# Patient Record
Sex: Male | Born: 1960 | Race: White | Hispanic: No | Marital: Married | Smoking: Never smoker
Health system: Southern US, Community
[De-identification: ages and names within clinical notes are randomized; demographics above are authoritative.]

## PROBLEM LIST (undated history)

## (undated) DIAGNOSIS — E785 Hyperlipidemia, unspecified: Secondary | ICD-10-CM

## (undated) HISTORY — DX: Hyperlipidemia, unspecified: E78.5

## (undated) HISTORY — PX: ANTERIOR CRUCIATE LIGAMENT REPAIR: SHX115

---

## 1998-09-06 ENCOUNTER — Encounter: Payer: Self-pay | Admitting: Family Medicine

## 1998-09-06 ENCOUNTER — Ambulatory Visit: Admission: RE | Admit: 1998-09-06 | Discharge: 1998-09-06 | Payer: Self-pay | Admitting: Family Medicine

## 1999-07-04 ENCOUNTER — Encounter: Payer: Self-pay | Admitting: Family Medicine

## 1999-07-04 ENCOUNTER — Emergency Department (HOSPITAL_COMMUNITY): Admission: EM | Admit: 1999-07-04 | Discharge: 1999-07-04 | Payer: Self-pay | Admitting: Emergency Medicine

## 2013-12-13 ENCOUNTER — Encounter: Payer: Self-pay | Admitting: *Deleted

## 2015-06-19 DIAGNOSIS — Z1159 Encounter for screening for other viral diseases: Secondary | ICD-10-CM | POA: Diagnosis not present

## 2015-06-19 DIAGNOSIS — Z0000 Encounter for general adult medical examination without abnormal findings: Secondary | ICD-10-CM | POA: Diagnosis not present

## 2015-06-19 DIAGNOSIS — E782 Mixed hyperlipidemia: Secondary | ICD-10-CM | POA: Diagnosis not present

## 2015-06-19 DIAGNOSIS — Z23 Encounter for immunization: Secondary | ICD-10-CM | POA: Diagnosis not present

## 2015-08-16 DIAGNOSIS — D126 Benign neoplasm of colon, unspecified: Secondary | ICD-10-CM | POA: Diagnosis not present

## 2015-08-16 DIAGNOSIS — D125 Benign neoplasm of sigmoid colon: Secondary | ICD-10-CM | POA: Diagnosis not present

## 2015-08-16 DIAGNOSIS — D123 Benign neoplasm of transverse colon: Secondary | ICD-10-CM | POA: Diagnosis not present

## 2015-08-16 DIAGNOSIS — K5730 Diverticulosis of large intestine without perforation or abscess without bleeding: Secondary | ICD-10-CM | POA: Diagnosis not present

## 2015-08-16 DIAGNOSIS — Z1211 Encounter for screening for malignant neoplasm of colon: Secondary | ICD-10-CM | POA: Diagnosis not present

## 2015-08-16 DIAGNOSIS — Z800 Family history of malignant neoplasm of digestive organs: Secondary | ICD-10-CM | POA: Diagnosis not present

## 2016-03-16 DIAGNOSIS — J209 Acute bronchitis, unspecified: Secondary | ICD-10-CM | POA: Diagnosis not present

## 2017-06-16 DIAGNOSIS — K141 Geographic tongue: Secondary | ICD-10-CM | POA: Diagnosis not present

## 2017-12-05 DIAGNOSIS — Z23 Encounter for immunization: Secondary | ICD-10-CM | POA: Diagnosis not present

## 2018-08-30 DIAGNOSIS — Z20828 Contact with and (suspected) exposure to other viral communicable diseases: Secondary | ICD-10-CM | POA: Diagnosis not present

## 2018-10-14 DIAGNOSIS — Q383 Other congenital malformations of tongue: Secondary | ICD-10-CM | POA: Diagnosis not present

## 2018-10-14 DIAGNOSIS — F411 Generalized anxiety disorder: Secondary | ICD-10-CM | POA: Diagnosis not present

## 2018-10-14 DIAGNOSIS — Z125 Encounter for screening for malignant neoplasm of prostate: Secondary | ICD-10-CM | POA: Diagnosis not present

## 2018-10-14 DIAGNOSIS — E782 Mixed hyperlipidemia: Secondary | ICD-10-CM | POA: Diagnosis not present

## 2018-10-14 DIAGNOSIS — Z0000 Encounter for general adult medical examination without abnormal findings: Secondary | ICD-10-CM | POA: Diagnosis not present

## 2018-10-14 DIAGNOSIS — L989 Disorder of the skin and subcutaneous tissue, unspecified: Secondary | ICD-10-CM | POA: Diagnosis not present

## 2018-10-31 DIAGNOSIS — L821 Other seborrheic keratosis: Secondary | ICD-10-CM | POA: Diagnosis not present

## 2018-10-31 DIAGNOSIS — D2272 Melanocytic nevi of left lower limb, including hip: Secondary | ICD-10-CM | POA: Diagnosis not present

## 2018-10-31 DIAGNOSIS — D229 Melanocytic nevi, unspecified: Secondary | ICD-10-CM | POA: Diagnosis not present

## 2018-10-31 DIAGNOSIS — L570 Actinic keratosis: Secondary | ICD-10-CM | POA: Diagnosis not present

## 2018-11-01 DIAGNOSIS — D101 Benign neoplasm of tongue: Secondary | ICD-10-CM | POA: Diagnosis not present

## 2018-11-01 DIAGNOSIS — K141 Geographic tongue: Secondary | ICD-10-CM | POA: Diagnosis not present

## 2018-11-01 DIAGNOSIS — J343 Hypertrophy of nasal turbinates: Secondary | ICD-10-CM | POA: Diagnosis not present

## 2019-01-05 DIAGNOSIS — R6884 Jaw pain: Secondary | ICD-10-CM | POA: Diagnosis not present

## 2019-03-01 DIAGNOSIS — F411 Generalized anxiety disorder: Secondary | ICD-10-CM | POA: Diagnosis not present

## 2019-05-15 DIAGNOSIS — F320 Major depressive disorder, single episode, mild: Secondary | ICD-10-CM | POA: Diagnosis not present

## 2019-05-15 DIAGNOSIS — F411 Generalized anxiety disorder: Secondary | ICD-10-CM | POA: Diagnosis not present

## 2019-05-15 DIAGNOSIS — E782 Mixed hyperlipidemia: Secondary | ICD-10-CM | POA: Diagnosis not present

## 2019-05-31 DIAGNOSIS — R6882 Decreased libido: Secondary | ICD-10-CM | POA: Diagnosis not present

## 2019-05-31 DIAGNOSIS — E782 Mixed hyperlipidemia: Secondary | ICD-10-CM | POA: Diagnosis not present

## 2019-10-20 ENCOUNTER — Other Ambulatory Visit: Payer: Self-pay | Admitting: Family Medicine

## 2019-10-20 DIAGNOSIS — Z125 Encounter for screening for malignant neoplasm of prostate: Secondary | ICD-10-CM | POA: Diagnosis not present

## 2019-10-20 DIAGNOSIS — Z0000 Encounter for general adult medical examination without abnormal findings: Secondary | ICD-10-CM | POA: Diagnosis not present

## 2019-10-20 DIAGNOSIS — R229 Localized swelling, mass and lump, unspecified: Secondary | ICD-10-CM | POA: Diagnosis not present

## 2019-10-20 DIAGNOSIS — E782 Mixed hyperlipidemia: Secondary | ICD-10-CM | POA: Diagnosis not present

## 2019-10-20 DIAGNOSIS — R222 Localized swelling, mass and lump, trunk: Secondary | ICD-10-CM

## 2019-10-20 DIAGNOSIS — Z85828 Personal history of other malignant neoplasm of skin: Secondary | ICD-10-CM | POA: Diagnosis not present

## 2019-10-20 DIAGNOSIS — Z23 Encounter for immunization: Secondary | ICD-10-CM | POA: Diagnosis not present

## 2019-10-20 DIAGNOSIS — F411 Generalized anxiety disorder: Secondary | ICD-10-CM | POA: Diagnosis not present

## 2019-10-23 ENCOUNTER — Other Ambulatory Visit: Payer: Self-pay

## 2019-10-25 ENCOUNTER — Ambulatory Visit
Admission: RE | Admit: 2019-10-25 | Discharge: 2019-10-25 | Disposition: A | Discharge status: Home / Self Care | Payer: BC Managed Care – PPO | Source: Ambulatory Visit | Attending: Family Medicine | Admitting: Family Medicine

## 2019-10-25 DIAGNOSIS — R222 Localized swelling, mass and lump, trunk: Secondary | ICD-10-CM | POA: Diagnosis not present

## 2019-10-25 DIAGNOSIS — D170 Benign lipomatous neoplasm of skin and subcutaneous tissue of head, face and neck: Secondary | ICD-10-CM | POA: Diagnosis not present

## 2019-10-25 DIAGNOSIS — R221 Localized swelling, mass and lump, neck: Secondary | ICD-10-CM | POA: Diagnosis not present

## 2019-10-25 DIAGNOSIS — D1779 Benign lipomatous neoplasm of other sites: Secondary | ICD-10-CM | POA: Diagnosis not present

## 2019-10-25 IMAGING — US US SOFT TISSUE
1 series · 8 of 8 positions shown · non-contrast
Comparison: None.

CLINICAL DATA: Subcutaneous mass anterior chest wall over sternum

EXAM:
ULTRASOUND OF HEAD/NECK SOFT TISSUES
TECHNIQUE: Ultrasound examination of the head and neck soft tissues was
performed in the area of clinical concern.

[Series 1: us soft tissue · 0.05mm/px · 8 acquisitions, 8 frames shown]
[im 1/8]
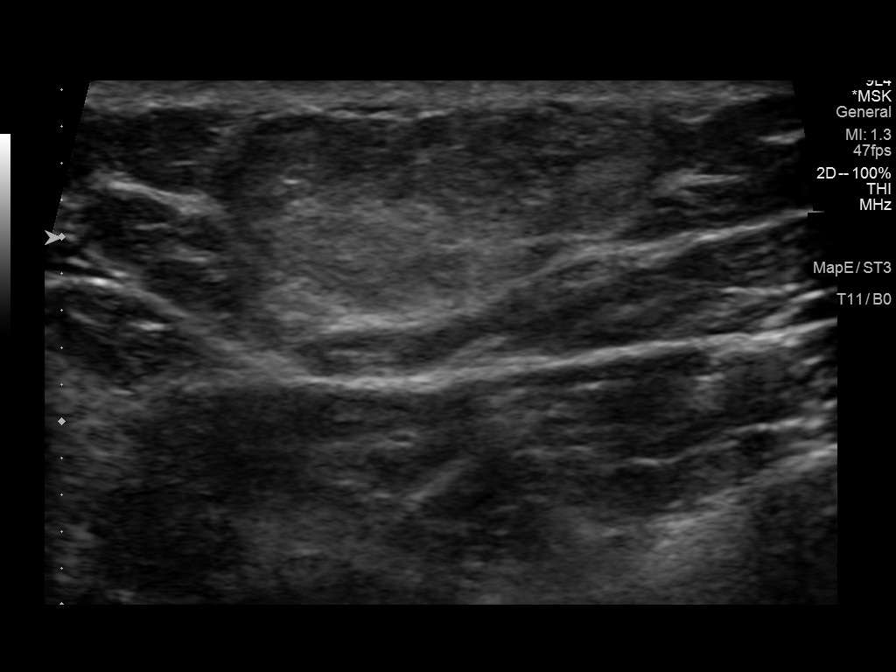
[im 2/8]
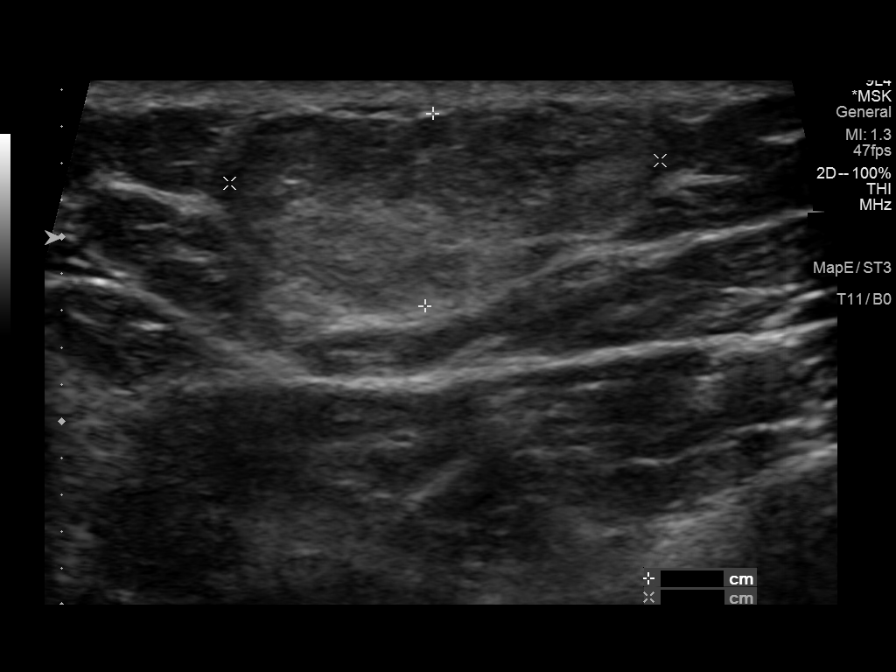
[im 3/8]
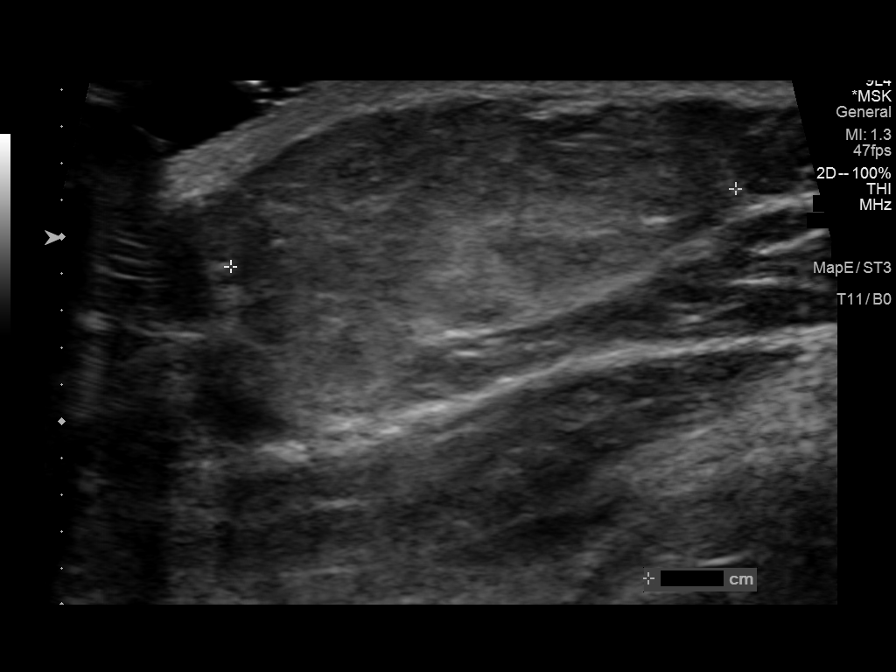
[im 4/8]
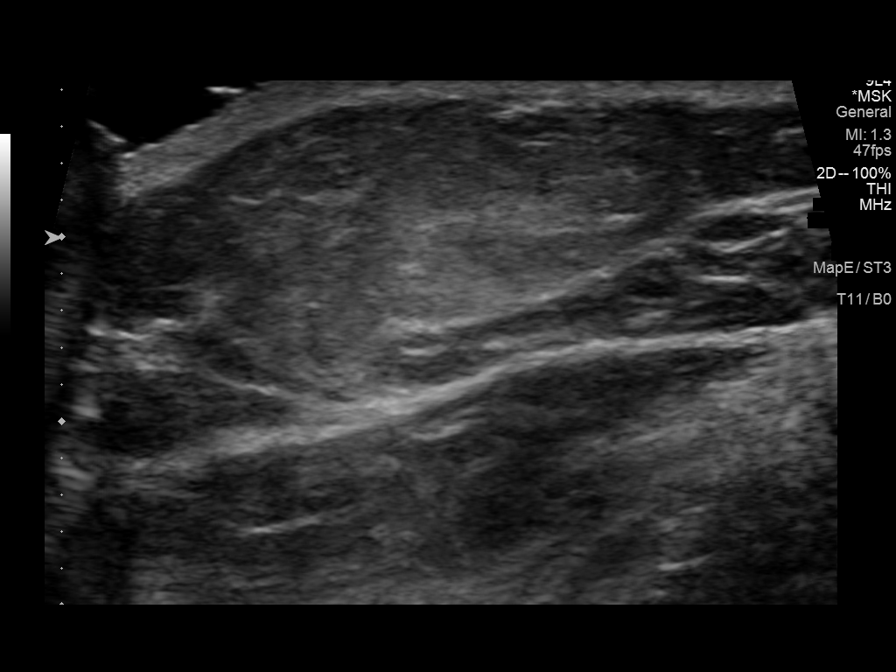
[im 5/8]
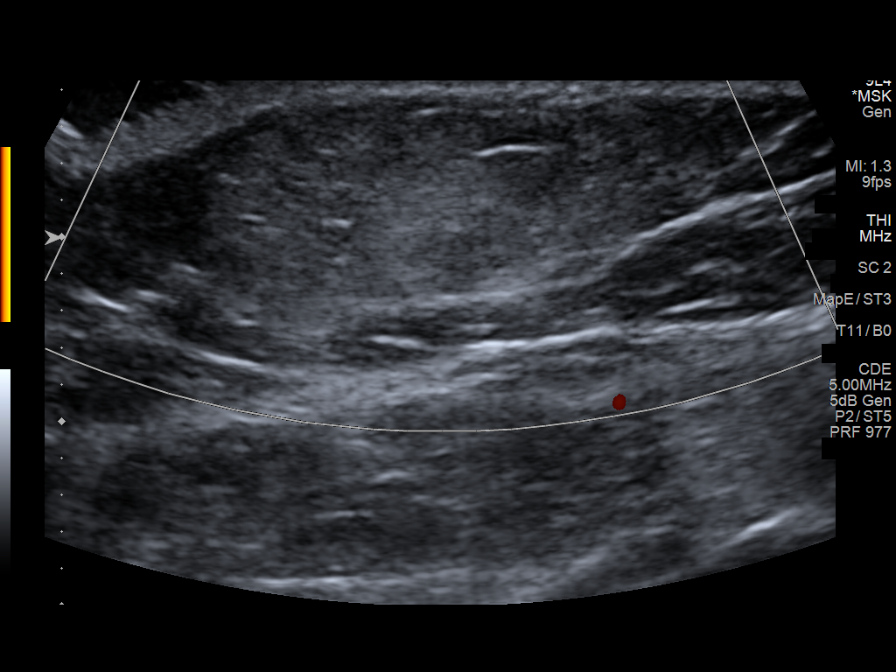
[im 6/8]
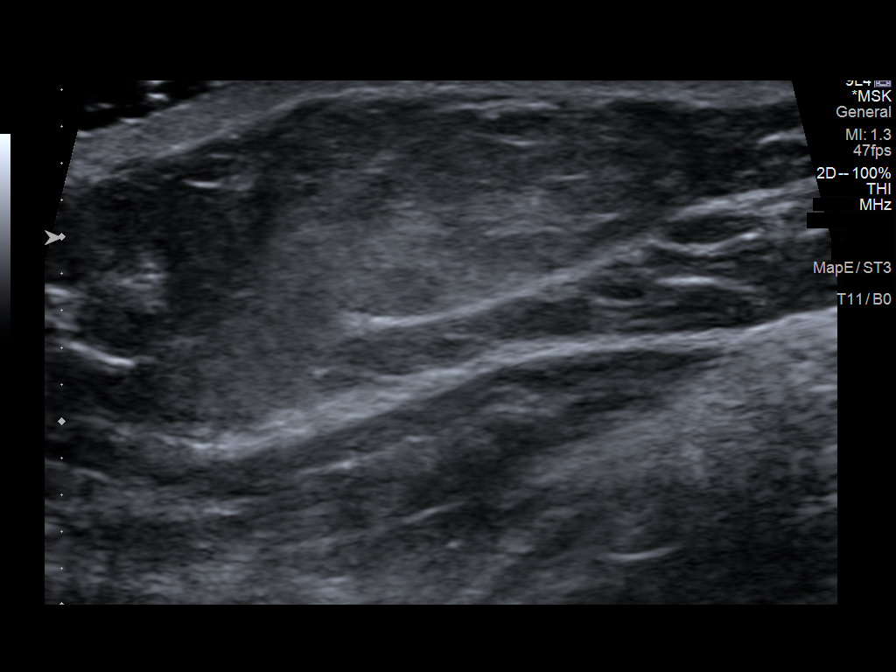
[im 7/8]
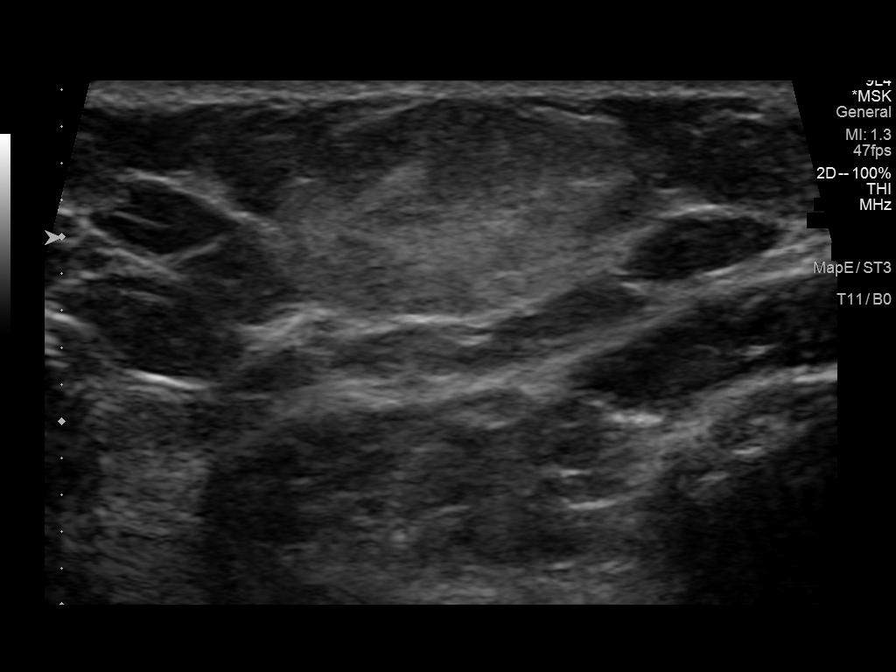
[im 8/8]
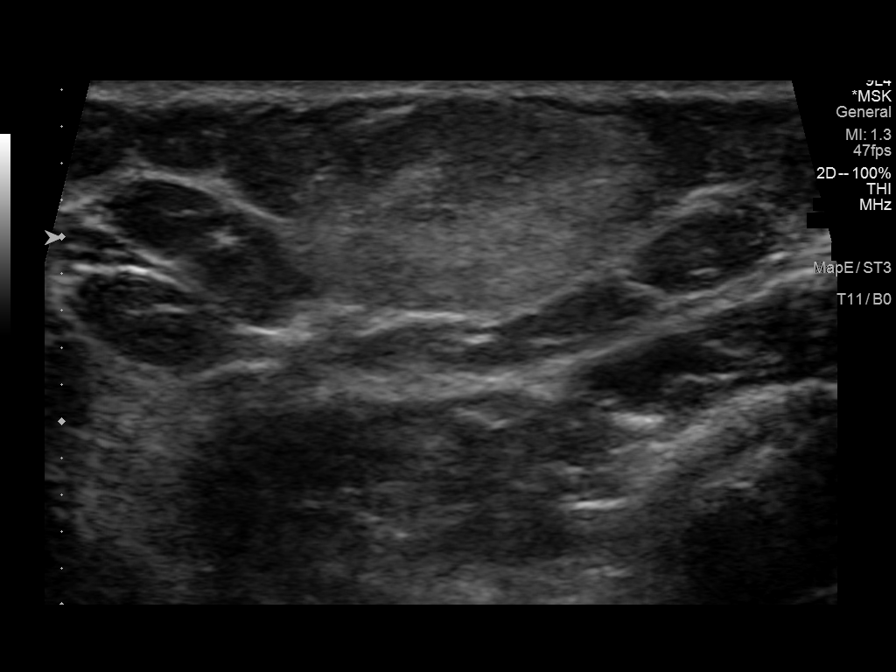

[8 of 8 positions shown; findings below may reference images not displayed]

FINDINGS: Ultrasound performed in the area concern, visualizing a 2.8 x 2.3 x
1.1 cm heterogeneous, slightly hyperechoic solid mass in the area of
concern. No other abnormality noted.
IMPRESSION: Slightly echogenic 2.8 cm subcutaneous mass in the area concern,
favor lipoma.

## 2019-12-18 DIAGNOSIS — L578 Other skin changes due to chronic exposure to nonionizing radiation: Secondary | ICD-10-CM | POA: Diagnosis not present

## 2019-12-18 DIAGNOSIS — B078 Other viral warts: Secondary | ICD-10-CM | POA: Diagnosis not present

## 2019-12-18 DIAGNOSIS — L573 Poikiloderma of Civatte: Secondary | ICD-10-CM | POA: Diagnosis not present

## 2019-12-18 DIAGNOSIS — Z85828 Personal history of other malignant neoplasm of skin: Secondary | ICD-10-CM | POA: Diagnosis not present

## 2019-12-18 DIAGNOSIS — L570 Actinic keratosis: Secondary | ICD-10-CM | POA: Diagnosis not present

## 2019-12-18 DIAGNOSIS — D225 Melanocytic nevi of trunk: Secondary | ICD-10-CM | POA: Diagnosis not present

## 2019-12-19 ENCOUNTER — Other Ambulatory Visit: Payer: Self-pay | Admitting: Surgery

## 2019-12-19 DIAGNOSIS — D171 Benign lipomatous neoplasm of skin and subcutaneous tissue of trunk: Secondary | ICD-10-CM | POA: Diagnosis not present

## 2020-11-29 DIAGNOSIS — Z86010 Personal history of colonic polyps: Secondary | ICD-10-CM | POA: Diagnosis not present

## 2020-11-29 DIAGNOSIS — K219 Gastro-esophageal reflux disease without esophagitis: Secondary | ICD-10-CM | POA: Diagnosis not present

## 2020-12-20 DIAGNOSIS — E7800 Pure hypercholesterolemia, unspecified: Secondary | ICD-10-CM | POA: Diagnosis not present

## 2020-12-20 DIAGNOSIS — Z0000 Encounter for general adult medical examination without abnormal findings: Secondary | ICD-10-CM | POA: Diagnosis not present

## 2020-12-20 DIAGNOSIS — Z125 Encounter for screening for malignant neoplasm of prostate: Secondary | ICD-10-CM | POA: Diagnosis not present

## 2020-12-20 DIAGNOSIS — R001 Bradycardia, unspecified: Secondary | ICD-10-CM | POA: Diagnosis not present

## 2020-12-20 DIAGNOSIS — R5383 Other fatigue: Secondary | ICD-10-CM | POA: Diagnosis not present

## 2020-12-20 DIAGNOSIS — R6882 Decreased libido: Secondary | ICD-10-CM | POA: Diagnosis not present

## 2020-12-20 DIAGNOSIS — Z23 Encounter for immunization: Secondary | ICD-10-CM | POA: Diagnosis not present

## 2021-01-21 ENCOUNTER — Emergency Department (HOSPITAL_BASED_OUTPATIENT_CLINIC_OR_DEPARTMENT_OTHER): Payer: BC Managed Care – PPO

## 2021-01-21 ENCOUNTER — Observation Stay (HOSPITAL_COMMUNITY): Payer: BC Managed Care – PPO

## 2021-01-21 ENCOUNTER — Inpatient Hospital Stay (HOSPITAL_BASED_OUTPATIENT_CLINIC_OR_DEPARTMENT_OTHER)
Admission: EM | Admit: 2021-01-21 | Discharge: 2021-01-22 | DRG: 066 | Disposition: A | Discharge status: Home / Self Care | Payer: BC Managed Care – PPO | Attending: Internal Medicine | Admitting: Internal Medicine

## 2021-01-21 ENCOUNTER — Inpatient Hospital Stay (HOSPITAL_COMMUNITY): Payer: BC Managed Care – PPO

## 2021-01-21 ENCOUNTER — Encounter (HOSPITAL_BASED_OUTPATIENT_CLINIC_OR_DEPARTMENT_OTHER): Payer: Self-pay | Admitting: *Deleted

## 2021-01-21 ENCOUNTER — Other Ambulatory Visit: Payer: Self-pay

## 2021-01-21 DIAGNOSIS — I6389 Other cerebral infarction: Secondary | ICD-10-CM | POA: Diagnosis not present

## 2021-01-21 DIAGNOSIS — G467 Other lacunar syndromes: Secondary | ICD-10-CM | POA: Diagnosis not present

## 2021-01-21 DIAGNOSIS — R200 Anesthesia of skin: Secondary | ICD-10-CM | POA: Diagnosis present

## 2021-01-21 DIAGNOSIS — Z888 Allergy status to other drugs, medicaments and biological substances status: Secondary | ICD-10-CM | POA: Diagnosis not present

## 2021-01-21 DIAGNOSIS — Z79899 Other long term (current) drug therapy: Secondary | ICD-10-CM

## 2021-01-21 DIAGNOSIS — Z8249 Family history of ischemic heart disease and other diseases of the circulatory system: Secondary | ICD-10-CM

## 2021-01-21 DIAGNOSIS — Z20822 Contact with and (suspected) exposure to covid-19: Secondary | ICD-10-CM | POA: Diagnosis not present

## 2021-01-21 DIAGNOSIS — E785 Hyperlipidemia, unspecified: Secondary | ICD-10-CM | POA: Diagnosis not present

## 2021-01-21 DIAGNOSIS — I10 Essential (primary) hypertension: Secondary | ICD-10-CM | POA: Diagnosis not present

## 2021-01-21 DIAGNOSIS — E7800 Pure hypercholesterolemia, unspecified: Secondary | ICD-10-CM

## 2021-01-21 DIAGNOSIS — R030 Elevated blood-pressure reading, without diagnosis of hypertension: Secondary | ICD-10-CM

## 2021-01-21 DIAGNOSIS — R29701 NIHSS score 1: Secondary | ICD-10-CM | POA: Diagnosis not present

## 2021-01-21 DIAGNOSIS — R001 Bradycardia, unspecified: Secondary | ICD-10-CM

## 2021-01-21 DIAGNOSIS — I639 Cerebral infarction, unspecified: Secondary | ICD-10-CM

## 2021-01-21 DIAGNOSIS — I440 Atrioventricular block, first degree: Secondary | ICD-10-CM

## 2021-01-21 DIAGNOSIS — R29818 Other symptoms and signs involving the nervous system: Secondary | ICD-10-CM | POA: Diagnosis not present

## 2021-01-21 DIAGNOSIS — G8324 Monoplegia of upper limb affecting left nondominant side: Secondary | ICD-10-CM | POA: Diagnosis present

## 2021-01-21 DIAGNOSIS — I6381 Other cerebral infarction due to occlusion or stenosis of small artery: Secondary | ICD-10-CM | POA: Diagnosis not present

## 2021-01-21 DIAGNOSIS — R202 Paresthesia of skin: Secondary | ICD-10-CM | POA: Diagnosis not present

## 2021-01-21 LAB — COMPREHENSIVE METABOLIC PANEL
ALT: 25 U/L (ref 0–44)
AST: 26 U/L (ref 15–41)
Albumin: 4.7 g/dL (ref 3.5–5.0)
Alkaline Phosphatase: 49 U/L (ref 38–126)
Anion gap: 9 (ref 5–15)
BUN: 15 mg/dL (ref 6–20)
CO2: 28 mmol/L (ref 22–32)
Calcium: 9.8 mg/dL (ref 8.9–10.3)
Chloride: 105 mmol/L (ref 98–111)
Creatinine, Ser: 1.15 mg/dL (ref 0.61–1.24)
GFR, Estimated: >60 mL/min (ref >60)
Glucose, Bld: 102 mg/dL — ABNORMAL HIGH (ref 70–99)
Potassium: 4.5 mmol/L (ref 3.5–5.1)
Sodium: 142 mmol/L (ref 135–145)
Total Bilirubin: 0.6 mg/dL (ref 0.3–1.2)
Total Protein: 7.4 g/dL (ref 6.5–8.1)

## 2021-01-21 LAB — RESP PANEL BY RT-PCR (FLU A&B, COVID) ARPGX2
Influenza A by PCR: NEGATIVE
Influenza B by PCR: NEGATIVE
SARS Coronavirus 2 by RT PCR: NEGATIVE

## 2021-01-21 LAB — DIFFERENTIAL
Abs Immature Granulocytes: 0.01 10*3/uL (ref 0.00–0.07)
Basophils Absolute: 0 10*3/uL (ref 0.0–0.1)
Basophils Relative: 1 %
Eosinophils Absolute: 0.3 10*3/uL (ref 0.0–0.5)
Eosinophils Relative: 4 %
Immature Granulocytes: 0 %
Lymphocytes Relative: 36 %
Lymphs Abs: 2.3 10*3/uL (ref 0.7–4.0)
Monocytes Absolute: 0.7 10*3/uL (ref 0.1–1.0)
Monocytes Relative: 12 %
Neutro Abs: 3 10*3/uL (ref 1.7–7.7)
Neutrophils Relative %: 47 %

## 2021-01-21 LAB — MAGNESIUM: Magnesium: 1.9 mg/dL (ref 1.7–2.4)

## 2021-01-21 LAB — ECHOCARDIOGRAM COMPLETE
AR max vel: 3.18 cm2
AV Area VTI: 2.96 cm2
AV Area mean vel: 3.14 cm2
AV Mean grad: 4 mmHg
AV Peak grad: 9.1 mmHg
Ao pk vel: 1.51 m/s
Area-P 1/2: 3.65 cm2
Height: 68 in
S' Lateral: 3.1 cm
Weight: 2960 oz

## 2021-01-21 LAB — HEMOGLOBIN A1C
Hgb A1c MFr Bld: 5.3 % (ref 4.8–5.6)
Mean Plasma Glucose: 105.41 mg/dL

## 2021-01-21 LAB — CBC
HCT: 48.2 % (ref 39.0–52.0)
Hemoglobin: 16.4 g/dL (ref 13.0–17.0)
MCH: 31.4 pg (ref 26.0–34.0)
MCHC: 34 g/dL (ref 30.0–36.0)
MCV: 92.3 fL (ref 80.0–100.0)
Platelets: 236 10*3/uL (ref 150–400)
RBC: 5.22 MIL/uL (ref 4.22–5.81)
RDW: 12 % (ref 11.5–15.5)
WBC: 6.3 10*3/uL (ref 4.0–10.5)
nRBC: 0 % (ref 0.0–0.2)

## 2021-01-21 LAB — LIPID PANEL
Cholesterol: 205 mg/dL — ABNORMAL HIGH (ref 0–200)
HDL: 66 mg/dL (ref >40)
LDL Cholesterol: 127 mg/dL — ABNORMAL HIGH (ref 0–99)
Total CHOL/HDL Ratio: 3.1 RATIO
Triglycerides: 58 mg/dL (ref <150)
VLDL: 12 mg/dL (ref 0–40)

## 2021-01-21 LAB — CBG MONITORING, ED: Glucose-Capillary: 97 mg/dL (ref 70–99)

## 2021-01-21 LAB — TSH: TSH: 1.19 u[IU]/mL (ref 0.350–4.500)

## 2021-01-21 LAB — APTT: aPTT: 25 seconds (ref 24–36)

## 2021-01-21 LAB — HIV ANTIBODY (ROUTINE TESTING W REFLEX): HIV Screen 4th Generation wRfx: NONREACTIVE

## 2021-01-21 LAB — PROTIME-INR
INR: 0.9 (ref 0.8–1.2)
Prothrombin Time: 12.2 seconds (ref 11.4–15.2)

## 2021-01-21 IMAGING — MR MR HEAD W/O CM
8 of 10 series · 36 of 48 positions shown · non-contrast
Comparison: Head CT [DATE].

CLINICAL DATA: Stroke follow-up.  Left arm numbness.

EXAM:
MRI HEAD WITHOUT CONTRAST
TECHNIQUE: Multiplanar, multiecho pulse sequences of the brain and surrounding
structures were obtained without intravenous contrast.

[Series 3: DWI · axial · 3.0mm · 1.09mm/px · z∈[-31,+103]mm · 9 of 94 slices shown (1 of 4)]
[im 1/94]
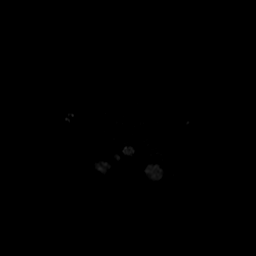
[im 12/94]
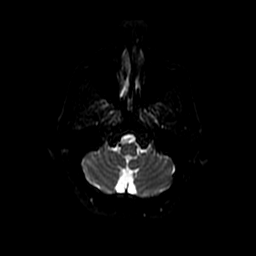
[im 24/94]
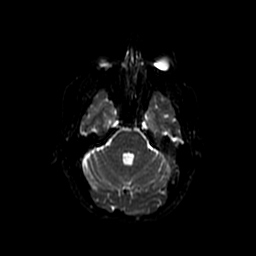
[im 35/94]
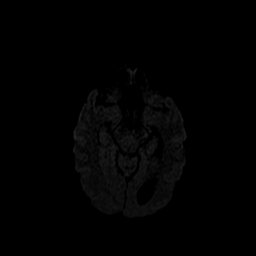
[im 47/94]
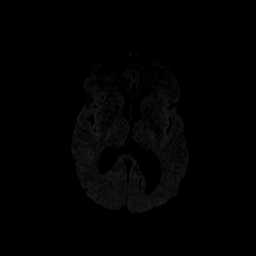
[im 59/94]
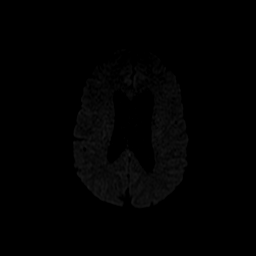
[im 70/94]
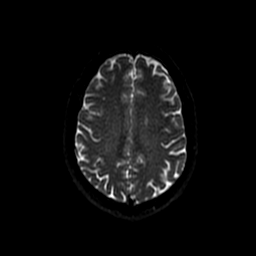
[im 82/94]
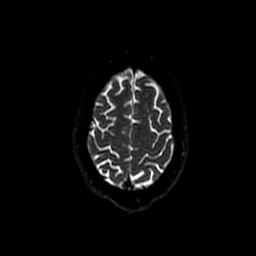
[im 94/94]
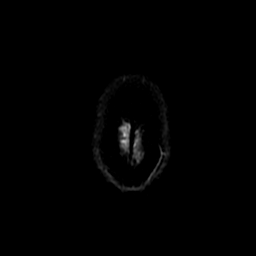

[Series 4: DWI · coronal · 5.0mm · 1.09mm/px · 7 of 70 slices shown (2 of 4)]
[im 1/70]
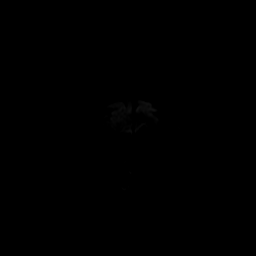
[im 12/70]
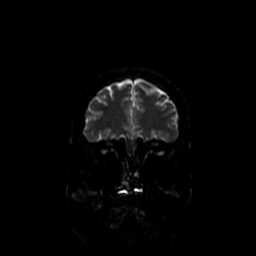
[im 24/70]
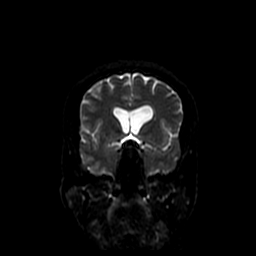
[im 35/70]
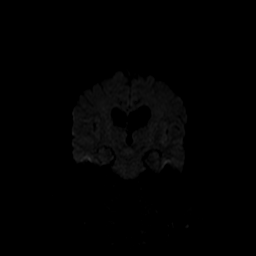
[im 47/70]
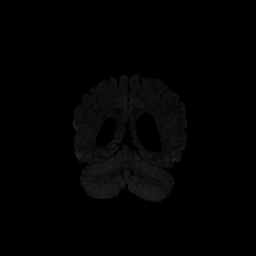
[im 58/70]
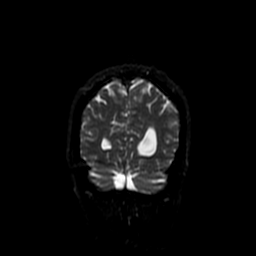
[im 70/70]
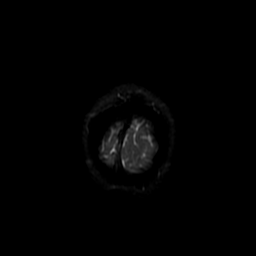

[Series 5: T1 · sagittal · 5.0mm · 0.47mm/px · 2 of 24 slices shown]
[im 1/24]
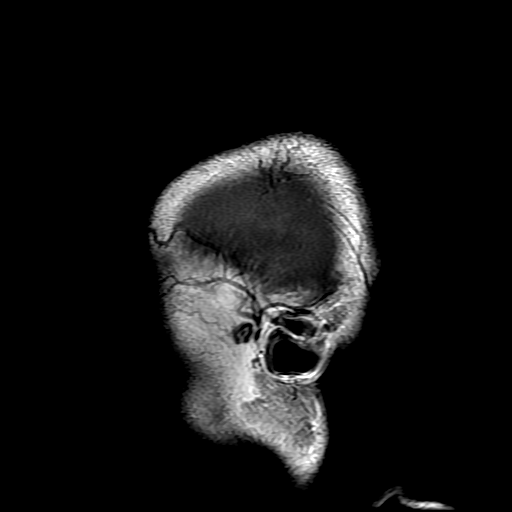
[im 24/24]
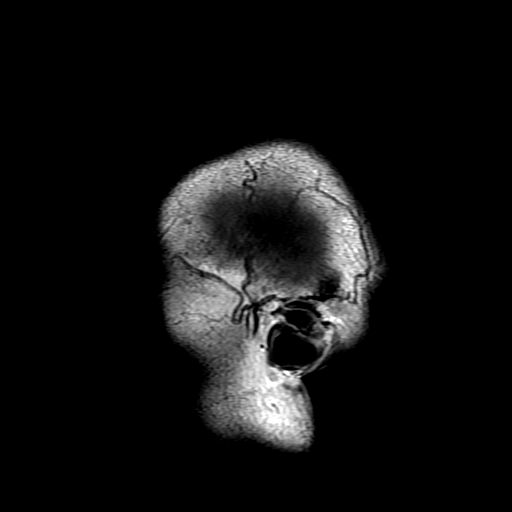

[Series 6: T2 · axial · 5.0mm · 0.45mm/px · z∈[-23,+124]mm · 3 of 26 slices shown (1 of 2)]
[im 1/26]
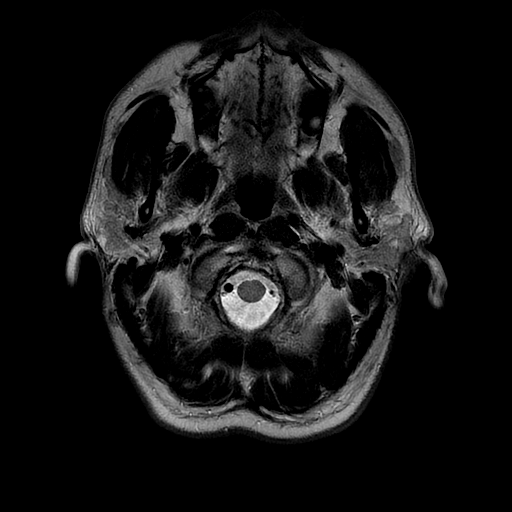
[im 13/26]
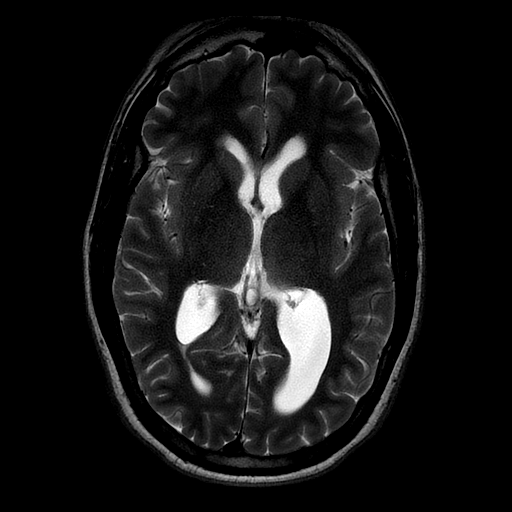
[im 26/26]
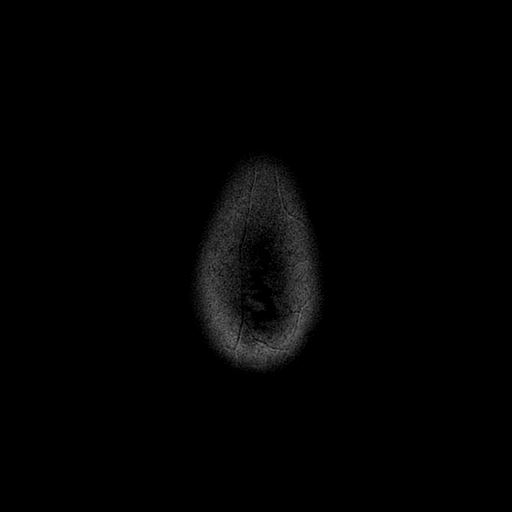

[Series 7: FLAIR · axial · 3.0mm · 0.45mm/px · z∈[-23,+124]mm · 3 of 26 slices shown]
[im 1/26]
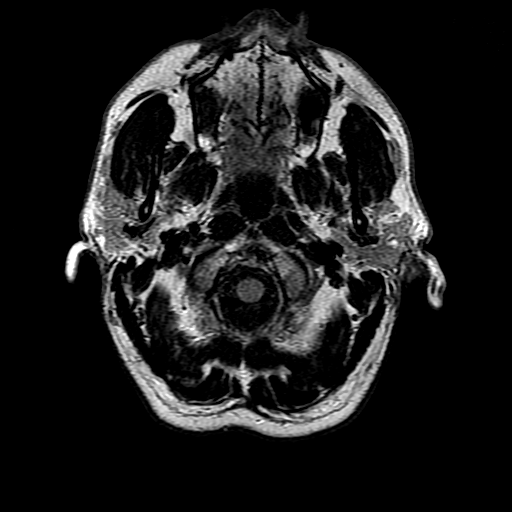
[im 13/26]
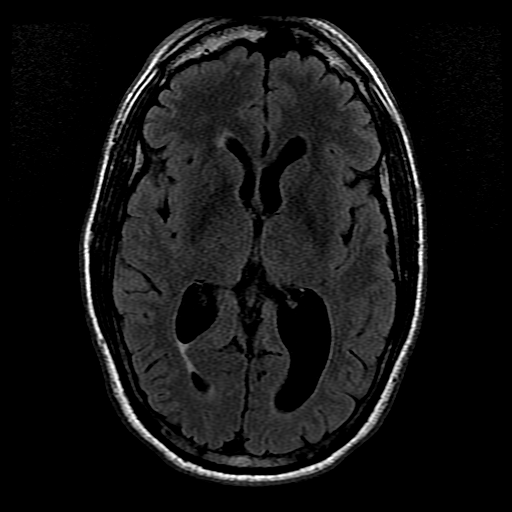
[im 26/26]
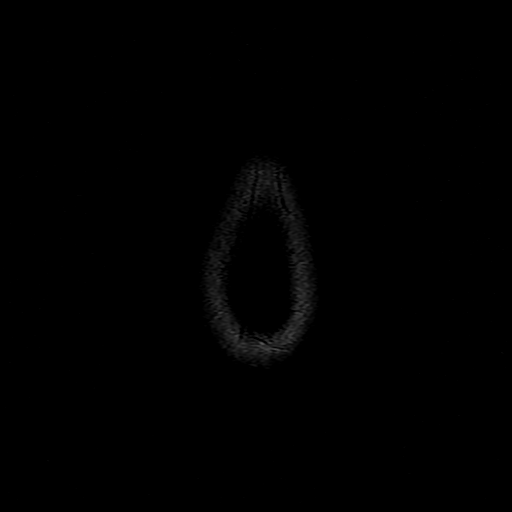

[Series 10: T2 · coronal · 5.0mm · 0.39mm/px · 3 of 30 slices shown (2 of 2)]
[im 1/30]
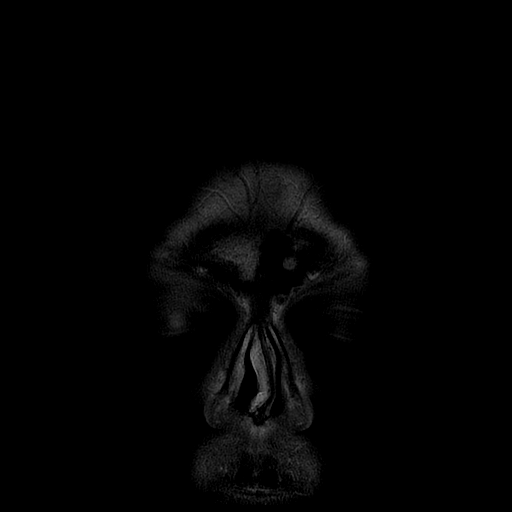
[im 15/30]
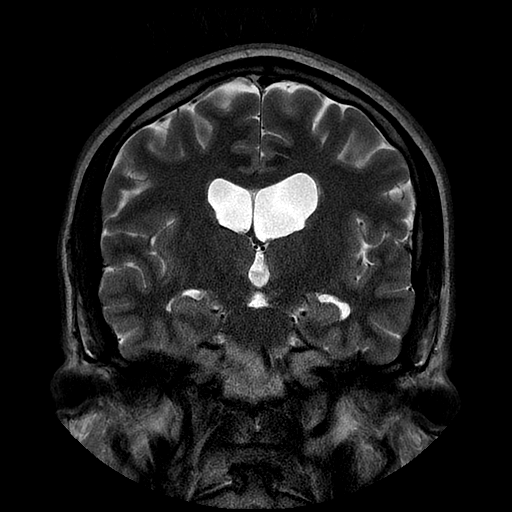
[im 30/30]
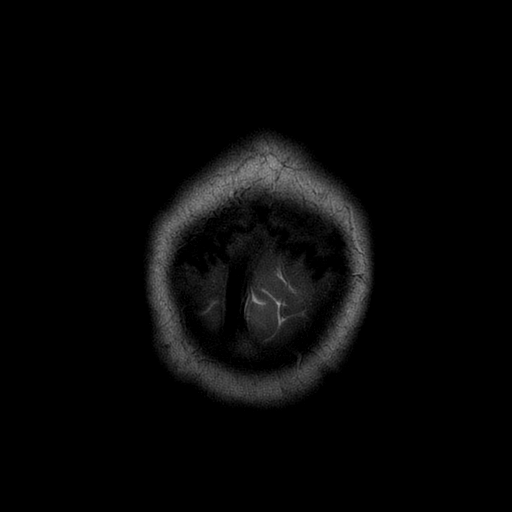

[Series 300: DWI · axial · 3.0mm · 1.09mm/px · z∈[-31,+103]mm · 5 of 47 slices shown (3 of 4)]
[im 1/47]
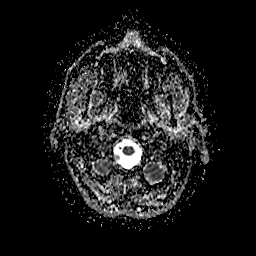
[im 12/47]
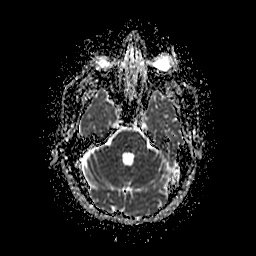
[im 24/47]
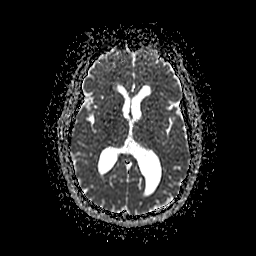
[im 35/47]
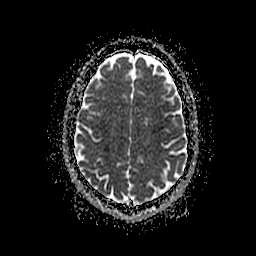
[im 47/47]
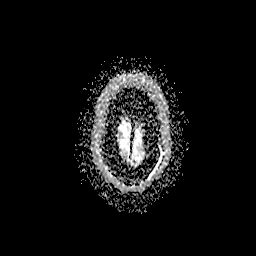

[Series 400: DWI · coronal · 5.0mm · 1.09mm/px · 4 of 35 slices shown (4 of 4)]
[im 1/35]
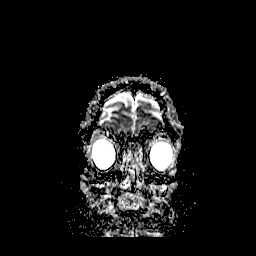
[im 12/35]
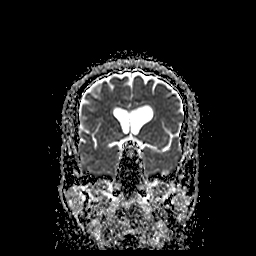
[im 23/35]
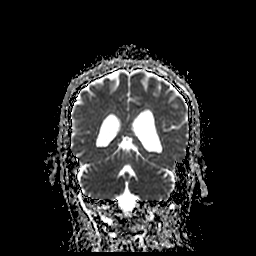
[im 35/35]
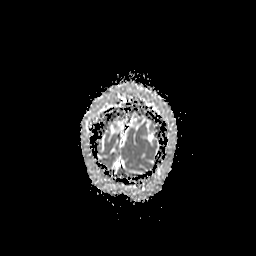

[36 of 48 positions shown; findings below may reference images not displayed]

FINDINGS: Brain: Subtle area of restricted diffusion in the right perirolandic
subcortical white matter, suggesting acute infarct. There is faint
corresponding T2 hyperintensity. No hemorrhage, extra-axial
collection or mass lesion. Mild ventriculomegaly, particularly at
the atria and occipital horns, may be developmental versus sequela
periventricular leukomalacia. Small amount of scattered foci of T2
hyperintensity are seen within the white matter of the cerebral
hemispheres, nonspecific, most likely related to early chronic
microangiopathy.

Vascular: Normal flow voids.

Skull and upper cervical spine: Normal marrow signal.

Sinuses/Orbits: Negative.

Other: None.
IMPRESSION: 1. Small area of restricted diffusion in the right frontoparietal
(perirolandic) subcortical white matter consistent with acute
infarct.
2. Mild chronic microvascular ischemic changes of the white matter.

## 2021-01-21 IMAGING — CT CT HEAD CODE STROKE
3 of 4 series · 16 of 47 positions shown, 19 images · non-contrast
Comparison: None.

CLINICAL DATA: Code stroke. Left arm numbness beginning this
morning

EXAM:
CT HEAD WITHOUT CONTRAST
TECHNIQUE: Contiguous axial images were obtained from the base of the skull
through the vertex without intravenous contrast.

[Series 3: head wo · axial · 0.46mm/px · z∈[+725,+865]mm · 10 of 34 slices shown, 13 images]
[im 3/34  brain]
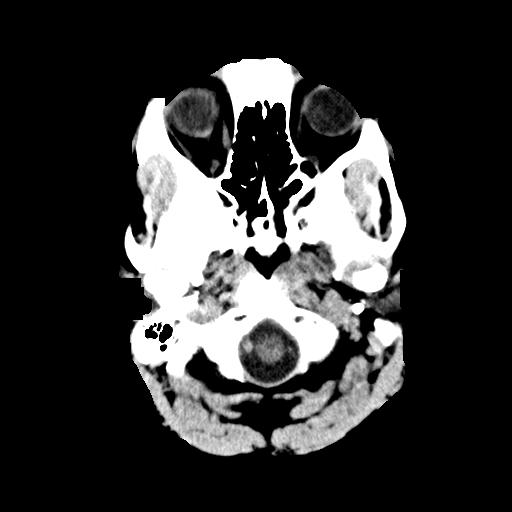
[im 3/34  bone]
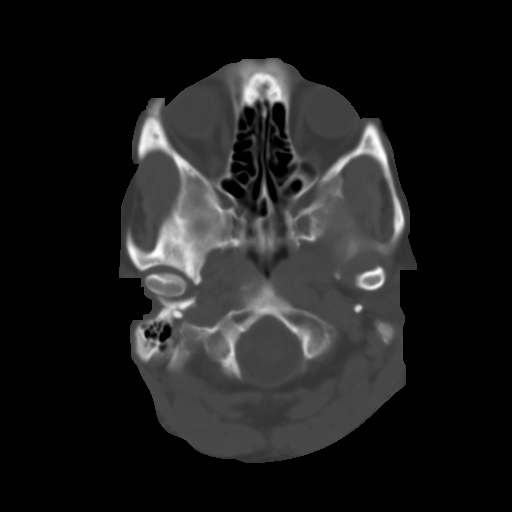
[im 5/34  brain]
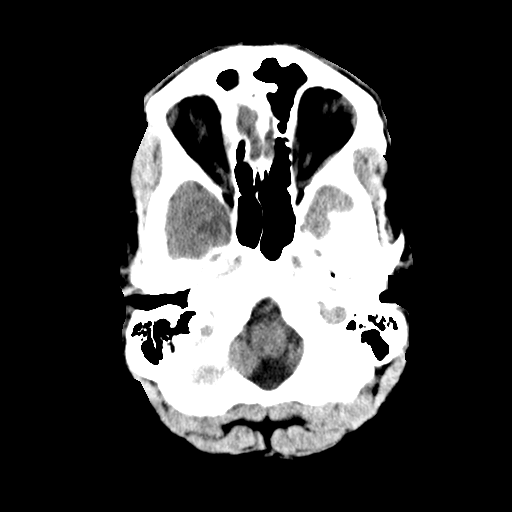
[im 10/34  brain]
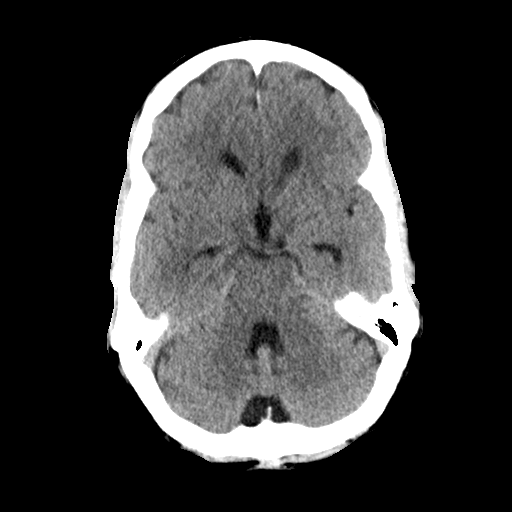
[im 12/34  brain]
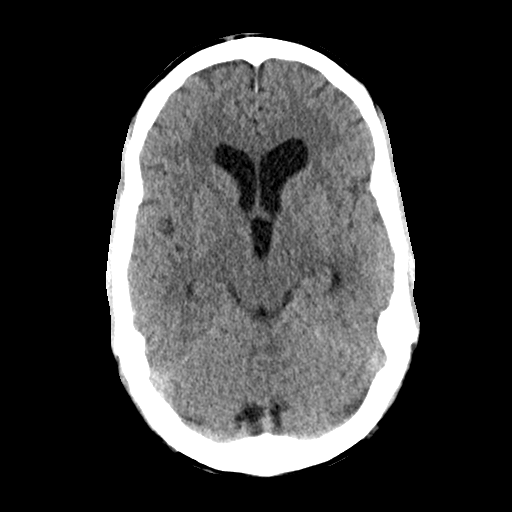
[im 15/34  brain]
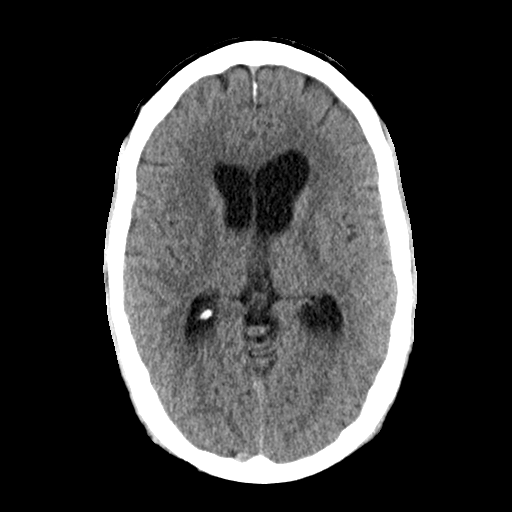
[im 15/34  bone]
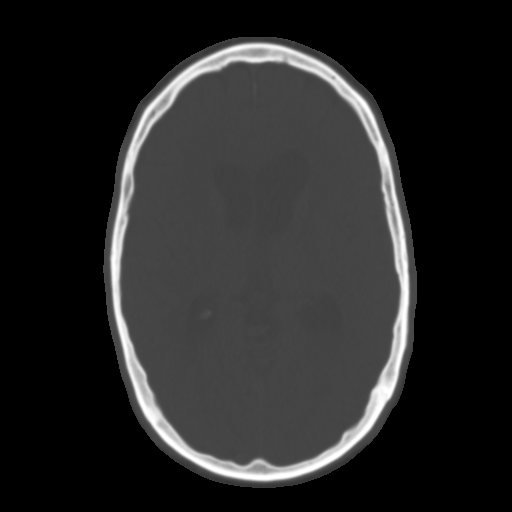
[im 19/34  brain]
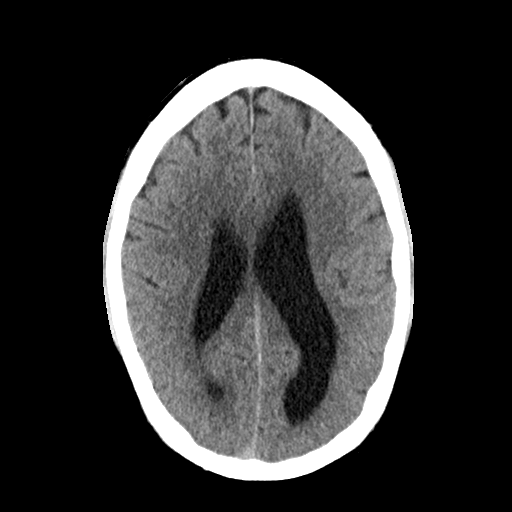
[im 22/34  brain]
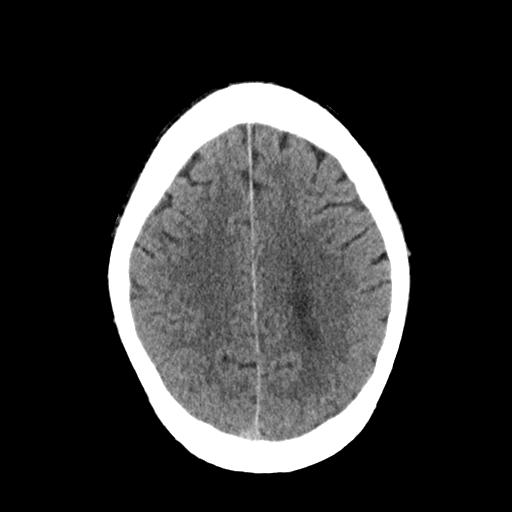
[im 24/34  brain]
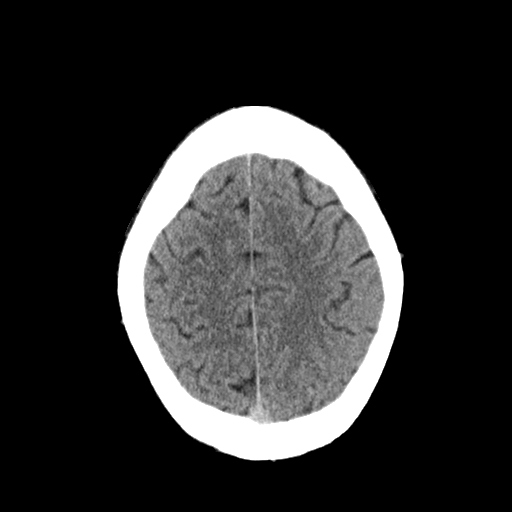
[im 29/34  brain]
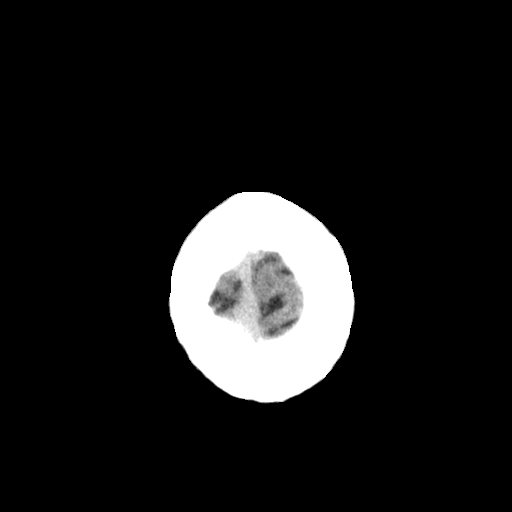
[im 29/34  bone]
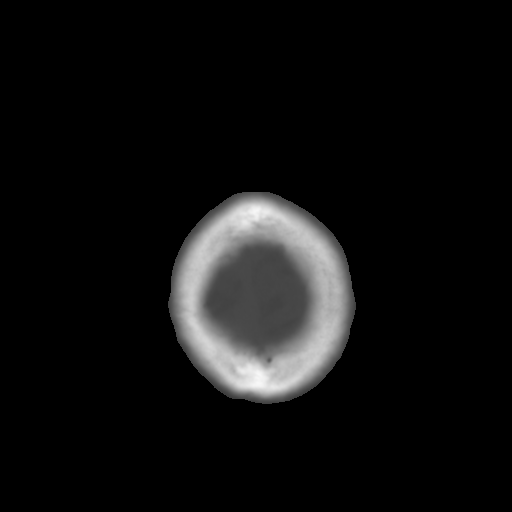
[im 31/34  brain]
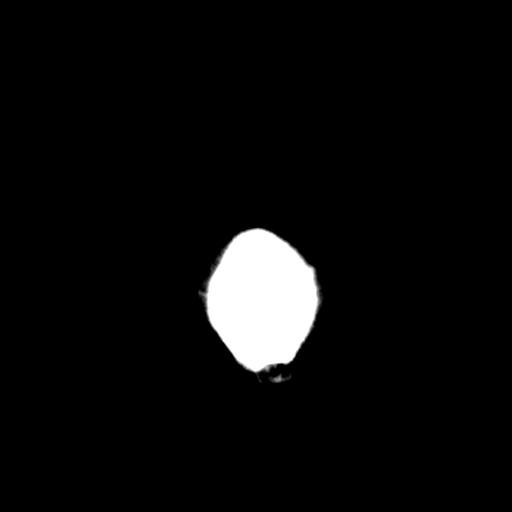

[Series 5: coronal soft · coronal · 0.34mm/px · 3 of 72 slices shown]
[im 24/72  brain]
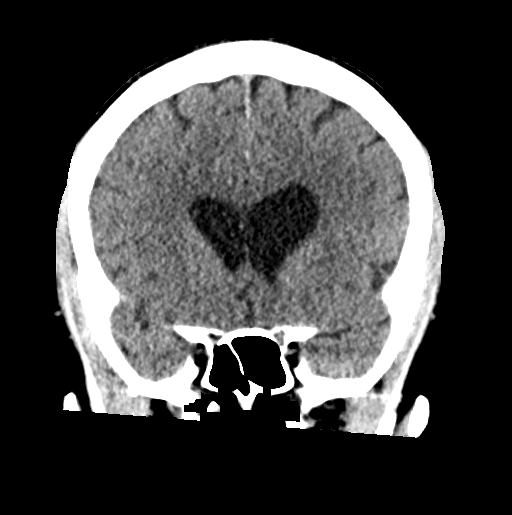
[im 32/72  brain]
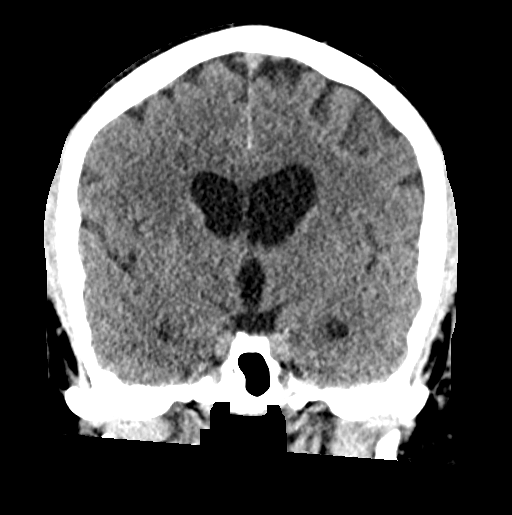
[im 40/72  brain]
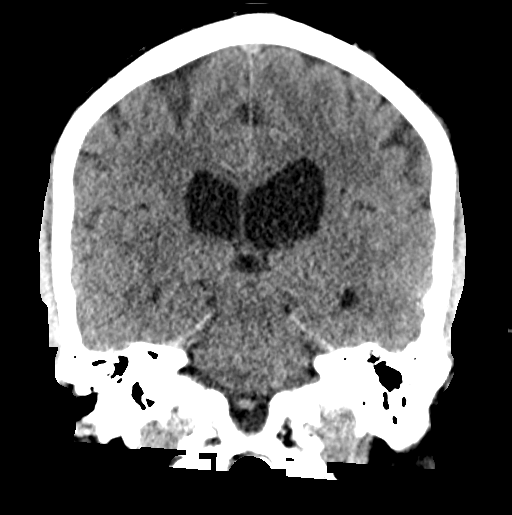

[Series 6: sagittal soft · sagittal · 0.37mm/px · 3 of 54 slices shown]
[im 18/54  brain]
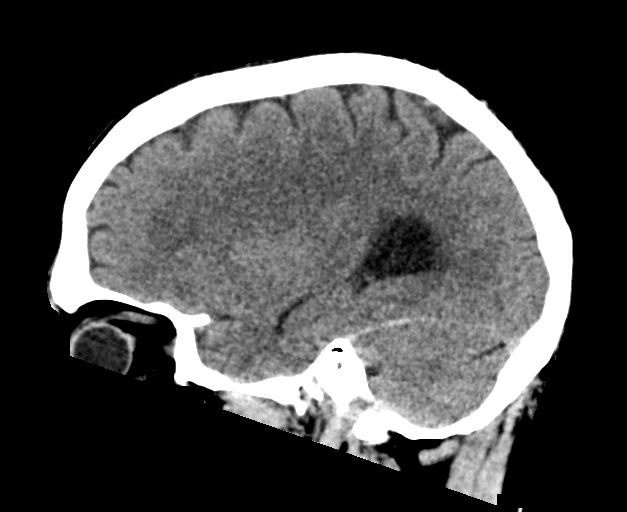
[im 27/54  brain]
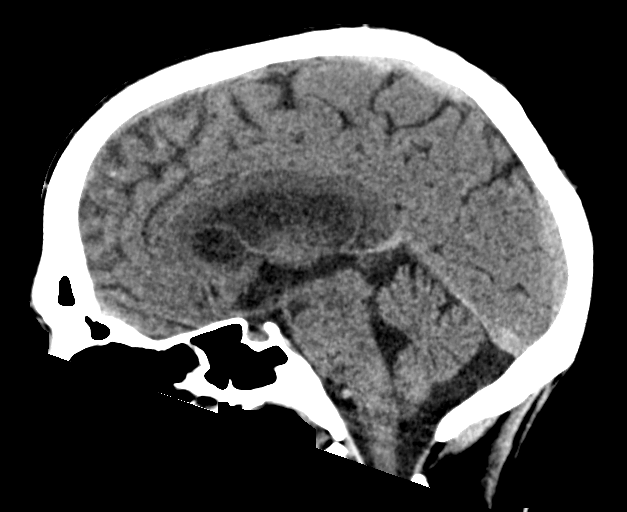
[im 36/54  brain]
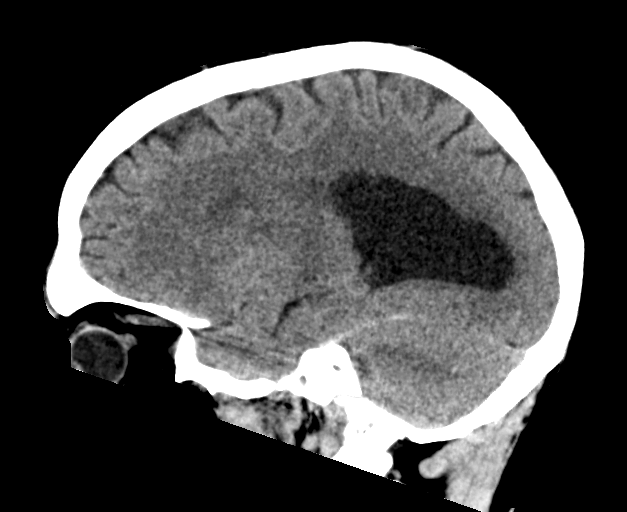

[16 of 47 positions shown; findings below may reference images not displayed]

FINDINGS: Brain: No evidence of acute infarction, hemorrhage, hydrocephalus,
extra-axial collection or mass lesion/mass effect. Lateral
ventriculomegaly, likely developmental.

Vascular: No hyperdense vessel or unexpected calcification.

Skull: Normal. Negative for fracture or focal lesion.

Sinuses/Orbits: No acute finding.

Other: These results were called by telephone at the time of
interpretation on [DATE] at [DATE] to provider LEBANACEVA ,
who verbally acknowledged these results.

ASPECTS ([place] Stroke Program Early CT Score)

- Ganglionic level infarction (caudate, lentiform nuclei, internal
capsule, insula, M1-M3 cortex): 7

- Supraganglionic infarction (M4-M6 cortex): 3

Total score (0-10 with 10 being normal): 10
IMPRESSION: 1. No acute finding.
2. ASPECTS is 10.

## 2021-01-21 IMAGING — MR MR MRA NECK WO/W CM
4 of 7 series · 25 of 48 positions shown · IV contrast (gadavist)
Comparison: None.

CLINICAL DATA: Neuro deficit, acute, stroke suspected; Carotid
stenosis screening, risk factors; acute stroke on MRI brain

EXAM:
MRA NECK WITHOUT AND WITH CONTRAST
MRA HEAD WITHOUT CONTRAST
TECHNIQUE: Multiplanar and multiecho pulse sequences of the neck were obtained
without and with intravenous contrast. Angiographic images of the
neck were obtained using MRA technique without and with intravenous
contrast; Angiographic images of the Circle of Willis were obtained
using MRA technique without intravenous contrast.
CONTRAST:  8 mL Gadavist

[Series 3: ax (id) · axial · 2.8mm · 0.47mm/px · z∈[-273,-17]mm · 9 of 184 slices shown (1 of 2)]
[im 1/184]
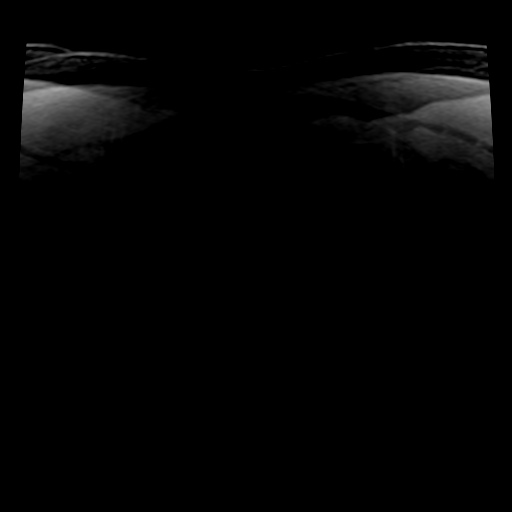
[im 23/184]
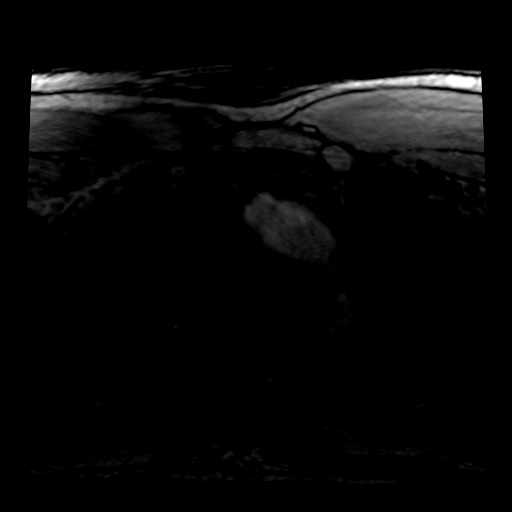
[im 46/184]
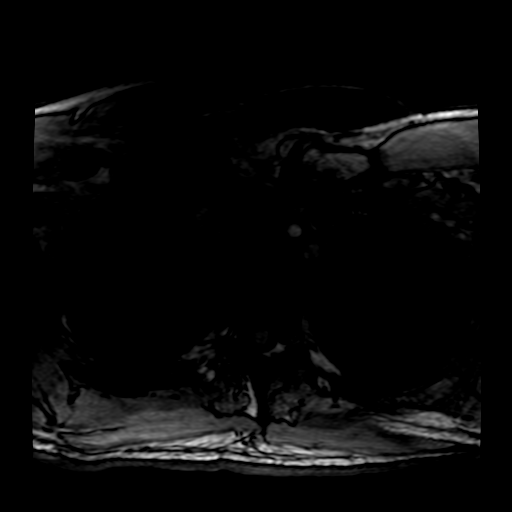
[im 69/184]
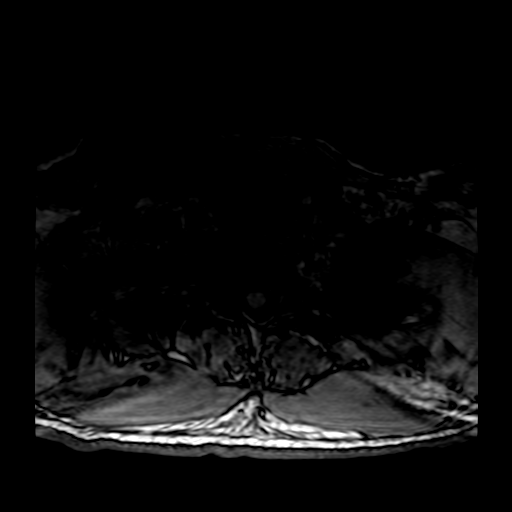
[im 92/184]
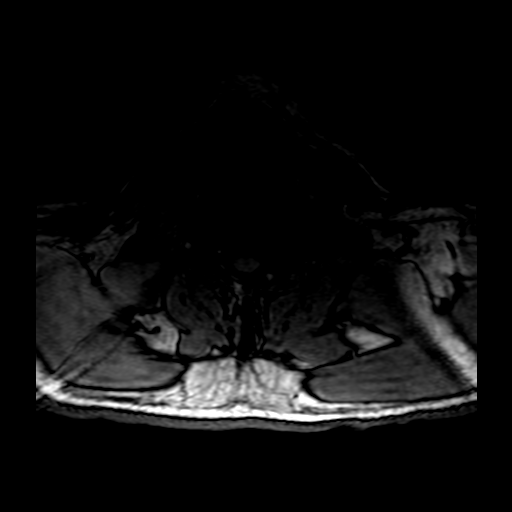
[im 115/184]
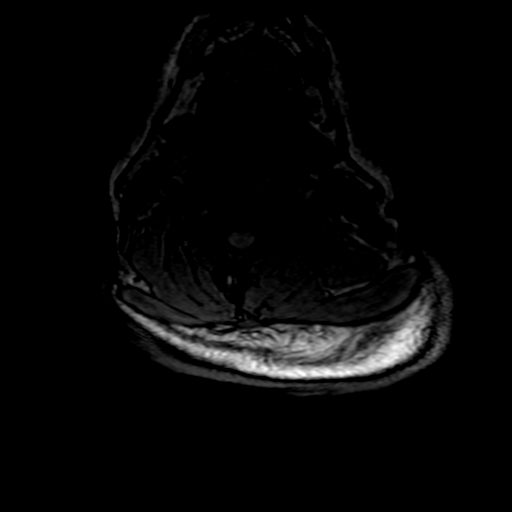
[im 138/184]
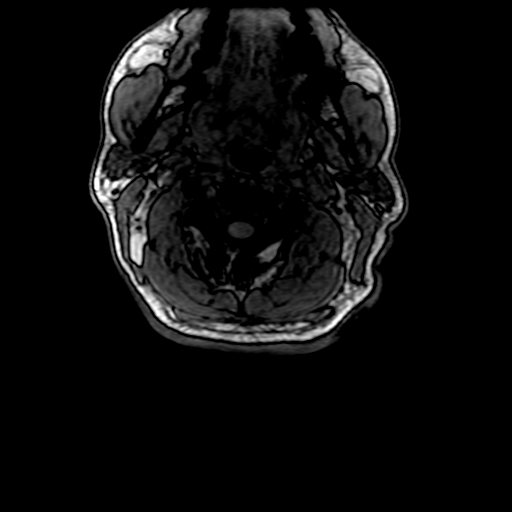
[im 161/184]
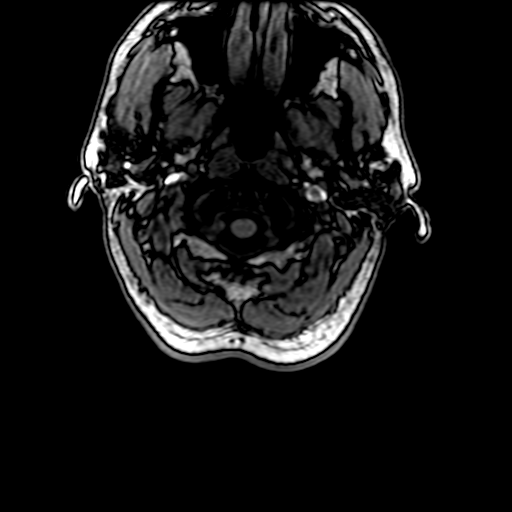
[im 184/184]
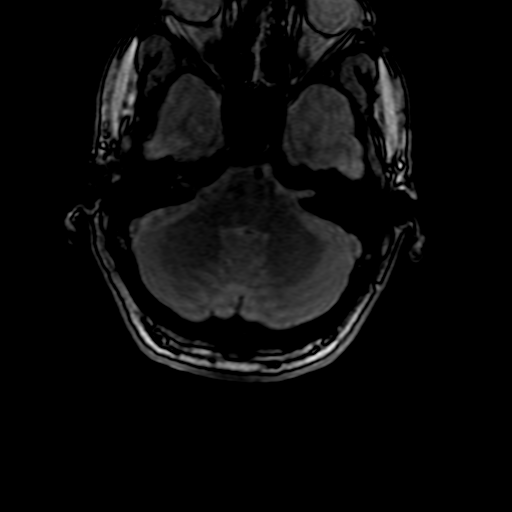

[Series 4: ax (id) · axial · 2.8mm · 0.94mm/px · z∈[-273,-17]mm · 9 of 184 slices shown (2 of 2)]
[im 1/184]
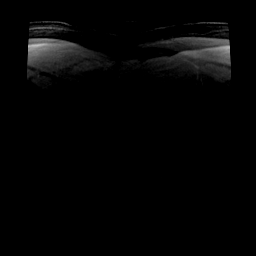
[im 23/184]
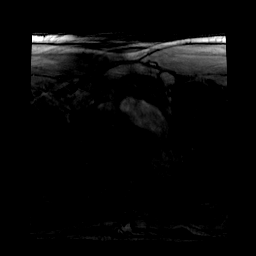
[im 46/184]
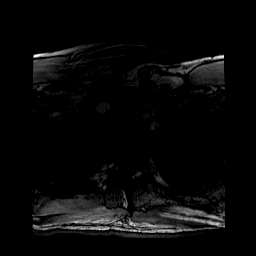
[im 69/184]
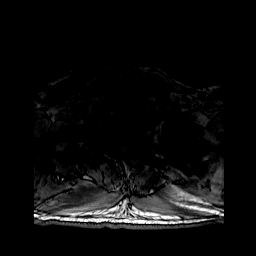
[im 92/184]
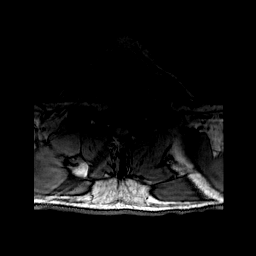
[im 115/184]
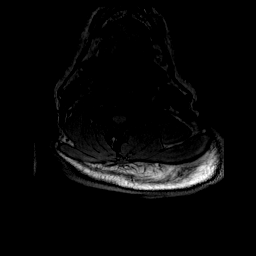
[im 138/184]
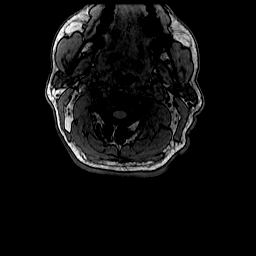
[im 161/184]
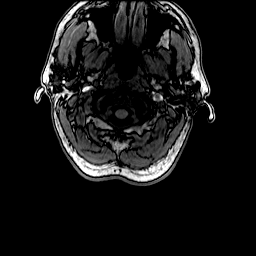
[im 184/184]
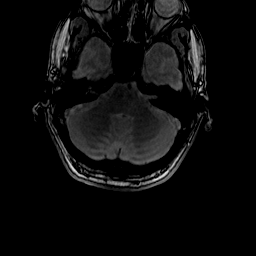

[Series 500: cor cemra ft · coronal · 1.4mm · 0.59mm/px · 4 of 124 slices shown]
[im 1/124]
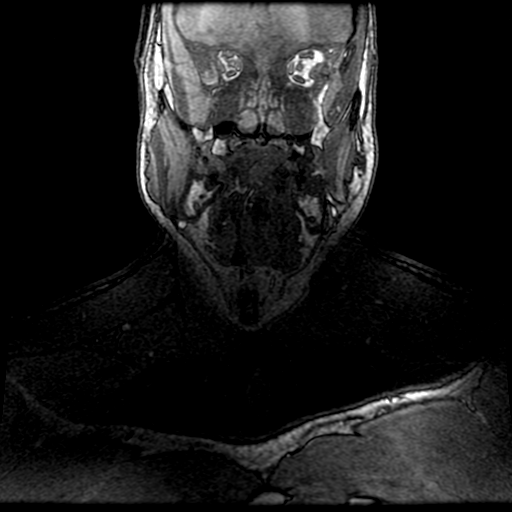
[im 25/124]
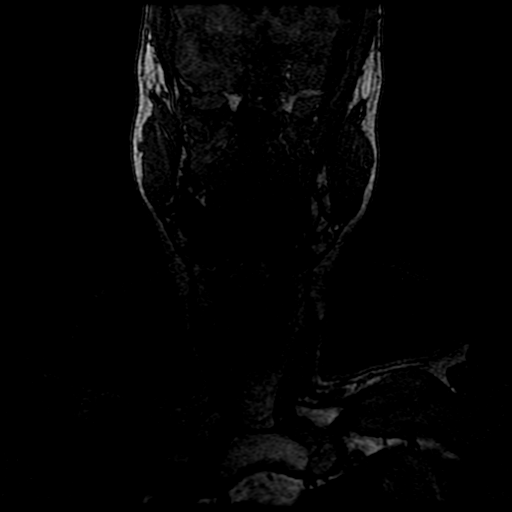
[im 74/124]
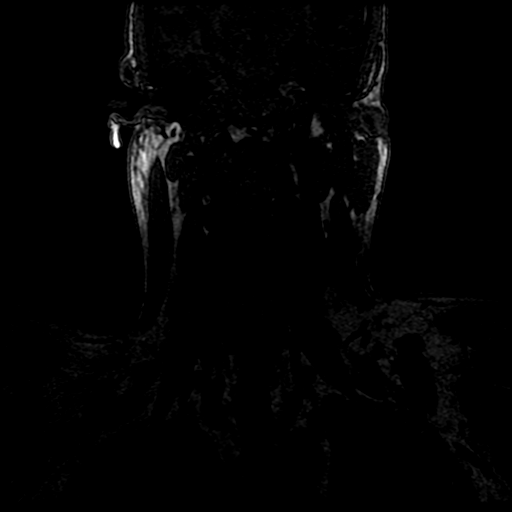
[im 124/124]
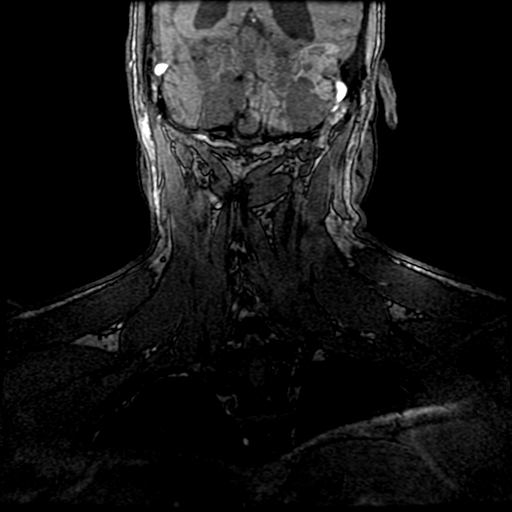

[Series 501: ph1/cor cemra ft · coronal · 1.4mm · 0.59mm/px · 3 of 124 slices shown]
[im 25/124]
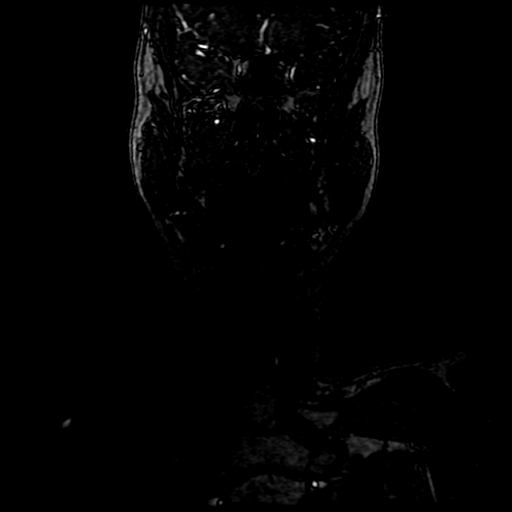
[im 74/124]
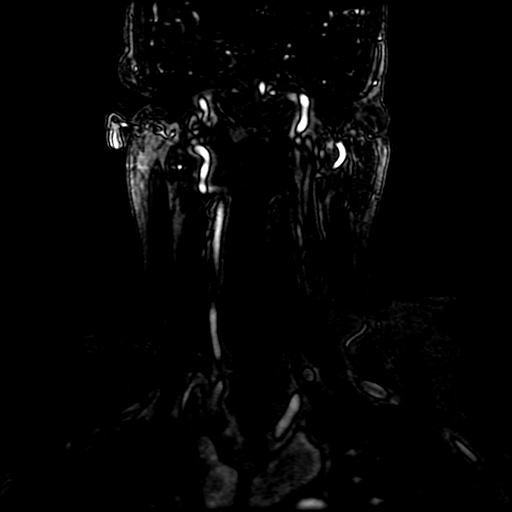
[im 124/124]
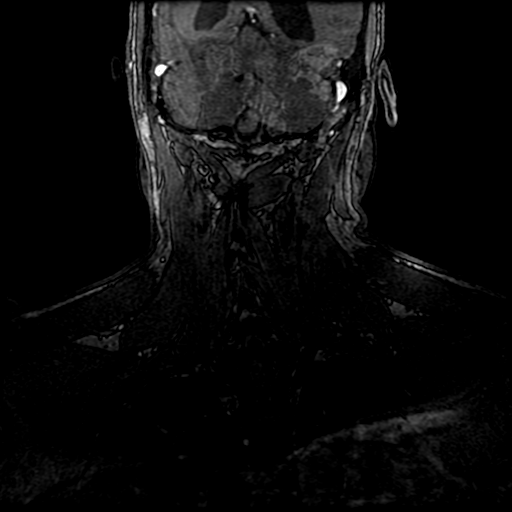

[25 of 48 positions shown; findings below may reference images not displayed]

FINDINGS: MRA NECK FINDINGS

Great vessel origins are patent. Common, internal, and external
carotid arteries are patent. Extracranial vertebral arteries are
patent. Right vertebral artery is dominant. No hemodynamically
significant stenosis or evidence of dissection.

MRA HEAD FINDINGS

Intracranial internal carotid arteries are patent. Anterior and
middle cerebral arteries are patent. Anterior communicating artery
is present. Intracranial vertebral arteries are patent. Right
vertebral artery is dominant. Basilar artery is patent. Right
posterior communicating artery is present. Possible left posterior
communicating artery. Posterior cerebral arteries are patent. No
significant stenosis or aneurysm.
IMPRESSION: No large vessel occlusion, hemodynamically significant stenosis, or
evidence of dissection.

## 2021-01-21 IMAGING — MR MR MRA HEAD W/O CM
2 series · 16 of 16 positions shown · IV contrast (gadavist)
Comparison: None.

CLINICAL DATA: Neuro deficit, acute, stroke suspected; Carotid
stenosis screening, risk factors; acute stroke on MRI brain

EXAM:
MRA NECK WITHOUT AND WITH CONTRAST
MRA HEAD WITHOUT CONTRAST
TECHNIQUE: Multiplanar and multiecho pulse sequences of the neck were obtained
without and with intravenous contrast. Angiographic images of the
neck were obtained using MRA technique without and with intravenous
contrast; Angiographic images of the Circle of Willis were obtained
using MRA technique without intravenous contrast.
CONTRAST:  8 mL Gadavist

[Series 3: (id) mt fs · axial · 1.4mm · 0.43mm/px · z∈[-68,+37]mm · 15 of 152 slices shown]
[im 1/152]
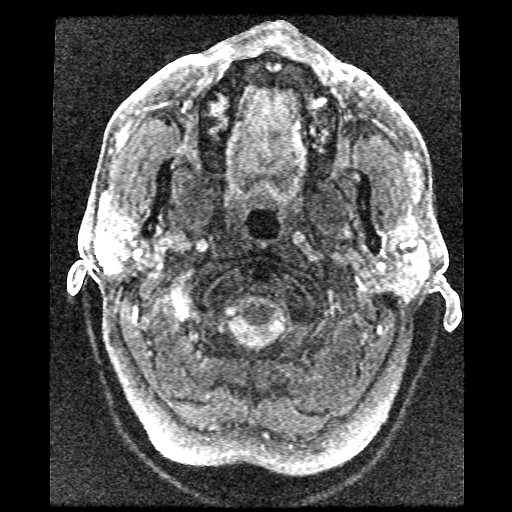
[im 11/152]
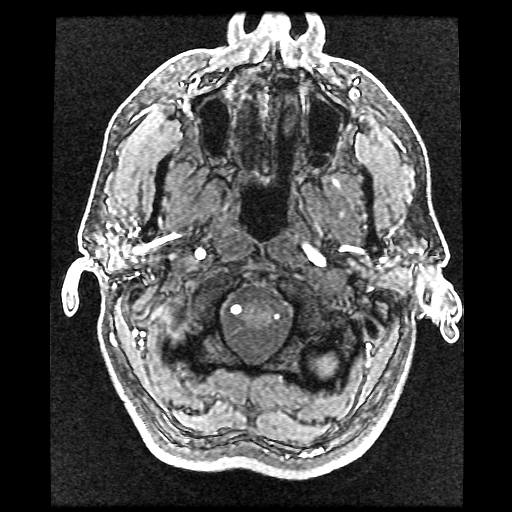
[im 22/152]
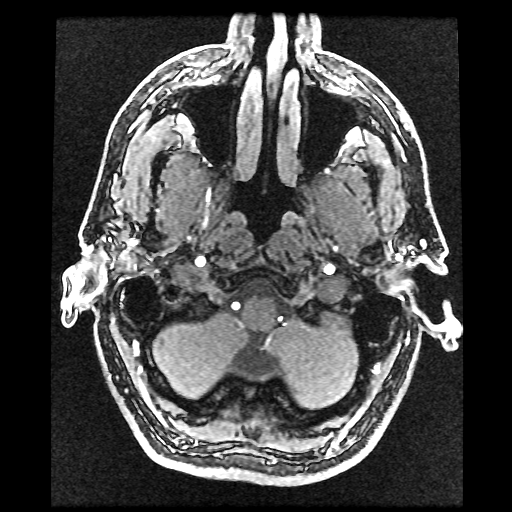
[im 33/152]
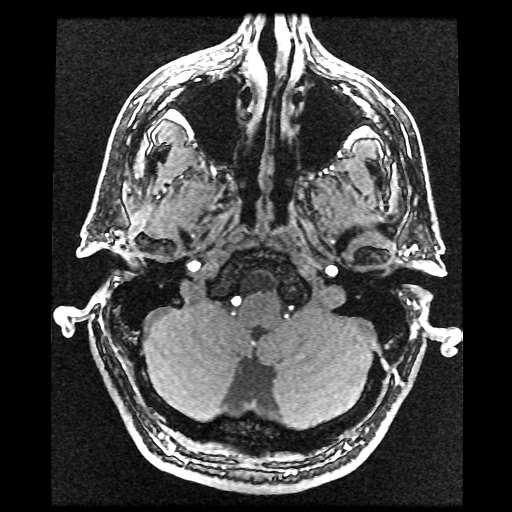
[im 44/152]
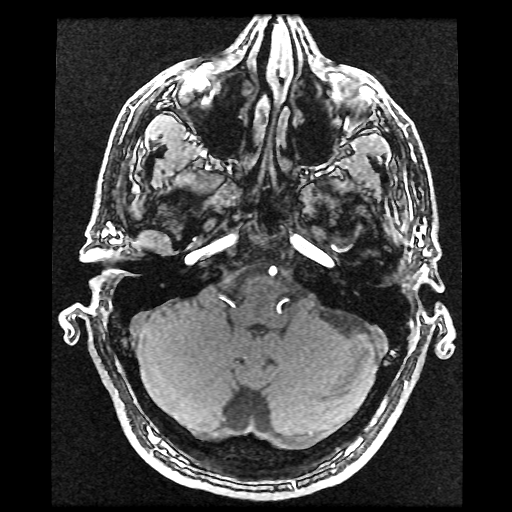
[im 54/152]
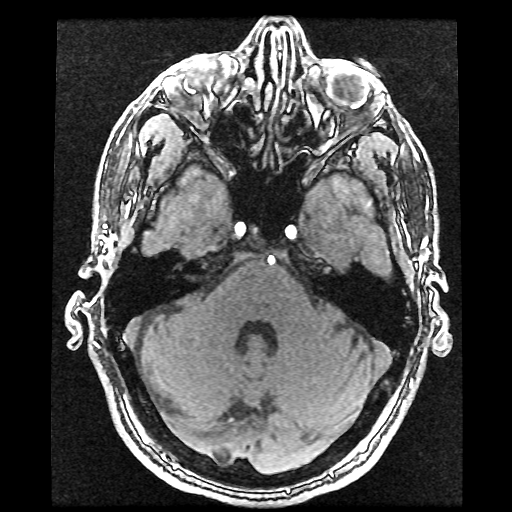
[im 65/152]
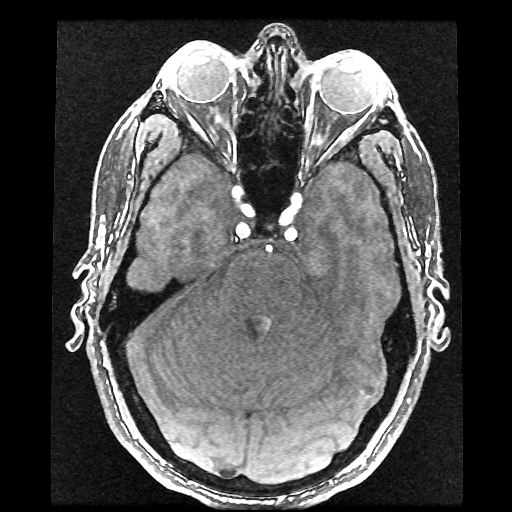
[im 76/152]
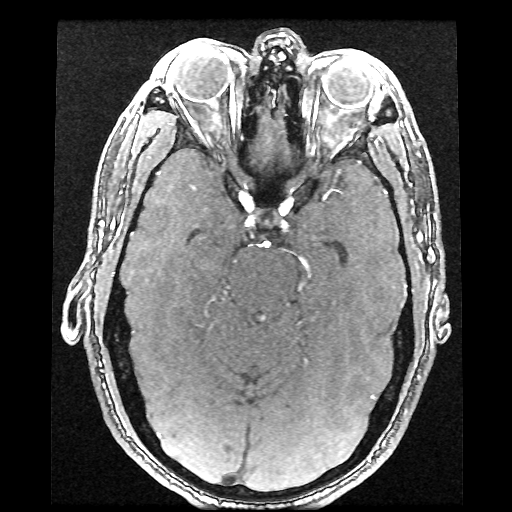
[im 87/152]
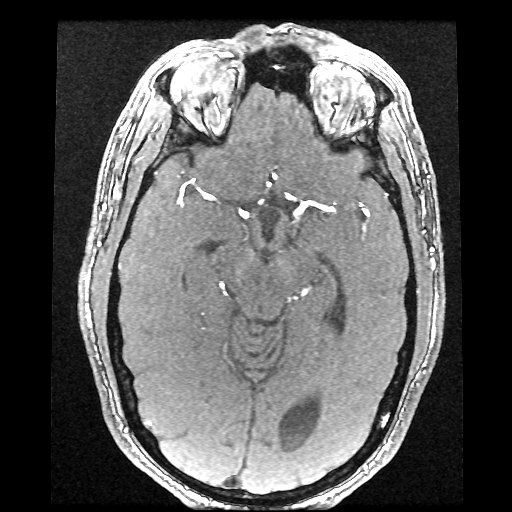
[im 98/152]
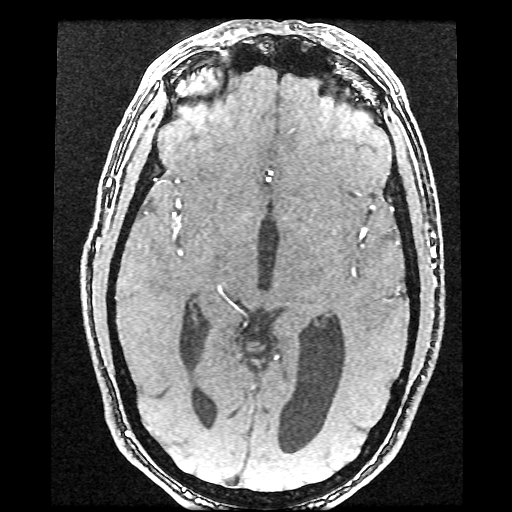
[im 108/152]
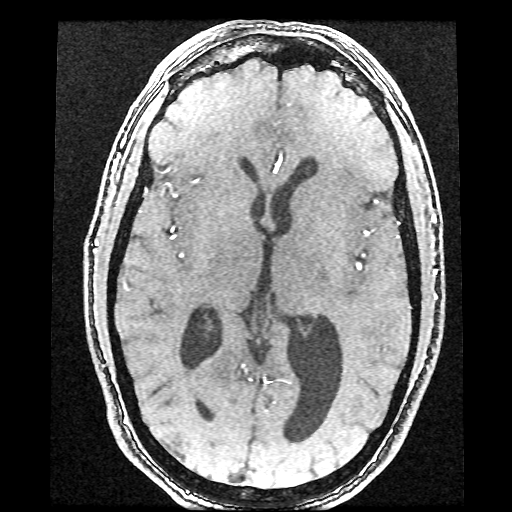
[im 119/152]
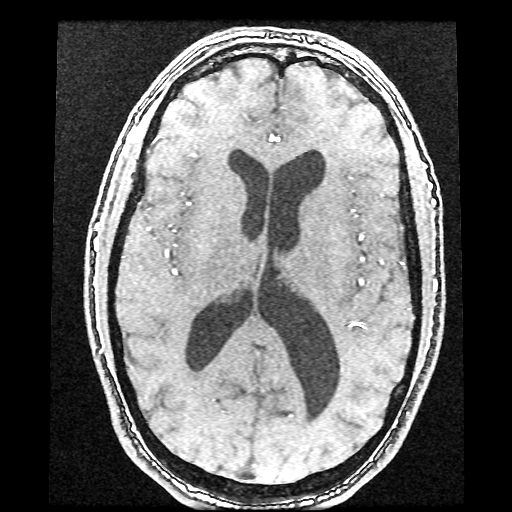
[im 130/152]
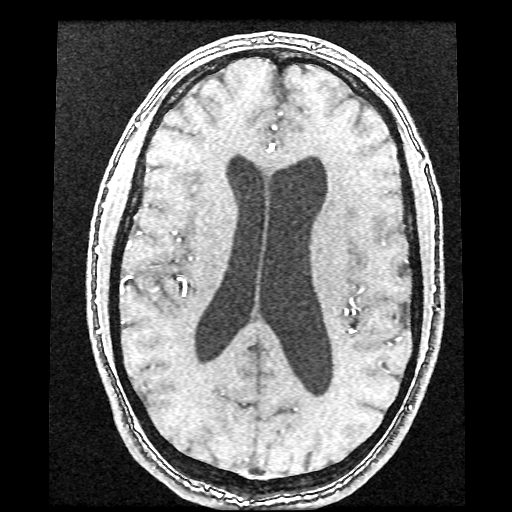
[im 141/152]
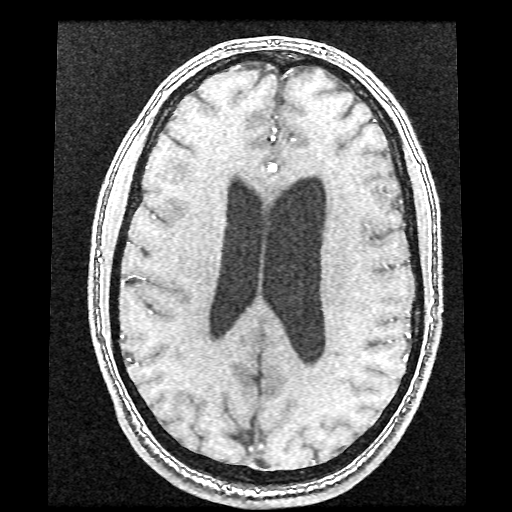
[im 152/152]
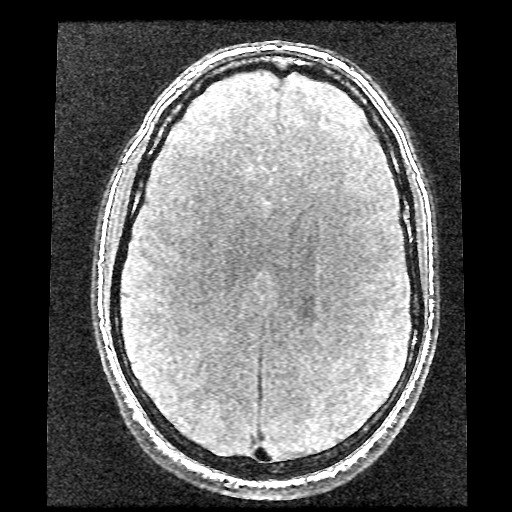

[Series 300: col:(id) mt fs · axial · 1.4mm · 0.43mm/px · 1 of 1 slices shown]
[im 1/1]
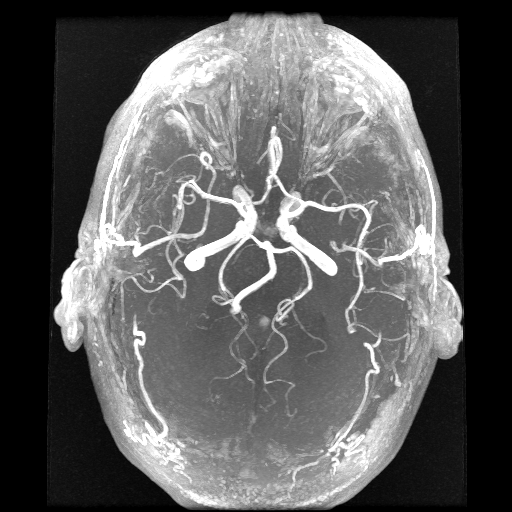

[16 of 16 positions shown; findings below may reference images not displayed]

FINDINGS: MRA NECK FINDINGS

Great vessel origins are patent. Common, internal, and external
carotid arteries are patent. Extracranial vertebral arteries are
patent. Right vertebral artery is dominant. No hemodynamically
significant stenosis or evidence of dissection.

MRA HEAD FINDINGS

Intracranial internal carotid arteries are patent. Anterior and
middle cerebral arteries are patent. Anterior communicating artery
is present. Intracranial vertebral arteries are patent. Right
vertebral artery is dominant. Basilar artery is patent. Right
posterior communicating artery is present. Possible left posterior
communicating artery. Posterior cerebral arteries are patent. No
significant stenosis or aneurysm.
IMPRESSION: No large vessel occlusion, hemodynamically significant stenosis, or
evidence of dissection.

## 2021-01-21 MED ORDER — ACETAMINOPHEN 650 MG RE SUPP: 650.0000 mg | RECTAL | Status: DC | PRN

## 2021-01-21 MED ORDER — ACETAMINOPHEN 325 MG PO TABS
650.0000 mg | ORAL_TABLET | ORAL | Status: DC | PRN
  Administered 2021-01-21: 650 mg via ORAL
  Filled 2021-01-21: qty 2

## 2021-01-21 MED ORDER — STROKE: EARLY STAGES OF RECOVERY BOOK
Freq: Once | Status: AC
  Filled 2021-01-21: qty 1

## 2021-01-21 MED ORDER — ACETAMINOPHEN 160 MG/5ML PO SOLN: 650.0000 mg | ORAL | Status: DC | PRN

## 2021-01-21 MED ORDER — CLOPIDOGREL BISULFATE 75 MG PO TABS
75.0000 mg | ORAL_TABLET | Freq: Every day | ORAL | Status: DC
  Administered 2021-01-21 – 2021-01-22 (×2): 75 mg via ORAL
  Filled 2021-01-21 (×2): qty 1

## 2021-01-21 MED ORDER — SODIUM CHLORIDE 0.9 % IV SOLN: Freq: Once | INTRAVENOUS | Status: AC

## 2021-01-21 MED ORDER — SODIUM CHLORIDE 0.9% FLUSH
3.0000 mL | Freq: Once | INTRAVENOUS | Status: AC
  Administered 2021-01-21: 3 mL via INTRAVENOUS
  Filled 2021-01-21: qty 3

## 2021-01-21 MED ORDER — ENOXAPARIN SODIUM 40 MG/0.4ML IJ SOSY
40.0000 mg | PREFILLED_SYRINGE | INTRAMUSCULAR | Status: DC
  Administered 2021-01-21 – 2021-01-22 (×2): 40 mg via SUBCUTANEOUS
  Filled 2021-01-21 (×2): qty 0.4

## 2021-01-21 MED ORDER — ASPIRIN EC 81 MG PO TBEC
81.0000 mg | DELAYED_RELEASE_TABLET | Freq: Every day | ORAL | Status: DC
  Administered 2021-01-21 – 2021-01-22 (×2): 81 mg via ORAL
  Filled 2021-01-21 (×2): qty 1

## 2021-01-21 MED ORDER — GADOBUTROL 1 MMOL/ML IV SOLN
8.0000 mL | Freq: Once | INTRAVENOUS | Status: AC | PRN
  Administered 2021-01-21: 8 mL via INTRAVENOUS

## 2021-01-21 NOTE — Progress Notes (Signed)
  Echocardiogram 2D Echocardiogram has been performed.  Merrie Roof F 01/21/2021, 10:54 AM

## 2021-01-21 NOTE — Progress Notes (Signed)
Brief Neurology Note  Patient presented with acute onset of left numbness and mild weakness of the left arm to [facility].  CT scan did not note any acute abnormality. TNK not administered 2/2 low NIHSS. Patient was seen in consultation by teleneurology this AM and transferred to [facility] for stroke workup.  MRI was significant for a small area of restricted diffusion in the right frontoparietal subcortical white matter consistent with acute infarct.    Interval recommendations:  - Permissive HTN x48 hrs from sx onset goal BP <220/110. PRN labetalol or hydralazine if BP above these parameters. Avoid oral antihypertensives. - MRA H&N - TTE  - Check A1c and LDL + add statin per guidelines - ASA 81mg  daily + plavix 75mg  daily x21 days f/b plavix 75mg  daily monotherapy after that - q4 hr neuro checks - STAT head CT for any change in neuro exam - Tele - PT/OT/SLP - Stroke education - Amb referral to neurology upon discharge   I have placed the appropriate orders. Stroke team will f/u in AM.  Su Monks, MD [facility] [phone]  If 7pm- 7am, please page neurology on call as listed in [facility].

## 2021-01-21 NOTE — ED Triage Notes (Signed)
Pt arrived ambulatory to treatment room from lobby reporting left arm numbness down through his hand. Says he woke up around 0415 this morning with the numbness, went to bed around 2230. Says the numbness got better and he then got on the treadmill around 0430 when he noticed the numbness returned. EDP is at bedside for initial assessment, CODE stroke initiated. Pt wife at the bedside.

## 2021-01-21 NOTE — Evaluation (Signed)
Physical Therapy Evaluation Patient Details Name: Mark Mclaughlin MRN: [id number] DOB: 02-06-1961 Today's Date: 01/21/2021  History of Present Illness  Pt is a 60yo male who came to ED due to L UE feeling numb and heavy. PMH: hyperlipideia  Clinical Impression  Pt admitted with above. Pt presenting with L UE weakness, numbness, and sensation of it being heavy. Pt very active and runs daily PTA. During ambulation pt reported L LE feeling weird, stating "Its probably because I haven't walked in a minute". RN notified. Pt more guarded and cautious at this time during ambulation. Acute PT to follow to progress indep and complete stair negotiation.       Recommendations for follow up therapy are one component of a multi-disciplinary discharge planning process, led by the attending physician.  Recommendations may be updated based on patient status, additional functional criteria and insurance authorization.  Follow Up Recommendations Outpatient PT (to address L UE weakness)    Assistance Recommended at Discharge Intermittent Supervision/Assistance  Functional Status Assessment Patient has had a recent decline in their functional status and demonstrates the ability to make significant improvements in function in a reasonable and predictable amount of time.  Equipment Recommendations  None recommended by PT    Recommendations for Other Services       Precautions / Restrictions Precautions Precautions: None Restrictions Weight Bearing Restrictions: No      Mobility  Bed Mobility Overal bed mobility: Independent                  Transfers Overall transfer level: Needs assistance Equipment used: None Transfers: Sit to/from Stand Sit to Stand: Supervision           General transfer comment: pt quick to stand up    Ambulation/Gait Ambulation/Gait assistance: Min guard Gait Distance (Feet): 150 Feet Assistive device: None Gait Pattern/deviations: Step-through  pattern;Decreased stride length Gait velocity: dec Gait velocity interpretation: >2.62 ft/sec, indicative of community ambulatory   General Gait Details: pt holding head in flexed position looking at floor, v/c's to look up/forward, pt guarded and reports "my left leg feel weird", pt states "its not numb or heavy like my arm, it's just weird, like a charlie horse in my calf" RN notified,  Stairs            Wheelchair Mobility    Modified Rankin (Stroke Patients Only) Modified Rankin (Stroke Patients Only) Pre-Morbid Rankin Score: No symptoms Modified Rankin: No significant disability     Balance Overall balance assessment: Mild deficits observed, not formally tested                                           Pertinent Vitals/Pain Pain Assessment: No/denies pain    Home Living Family/patient expects to be discharged to:: Private residence Living Arrangements: Spouse/significant other Available Help at Discharge: Family;Available 24 hours/day Type of Home: House Home Access: Stairs to enter Entrance Stairs-Rails: Right;None Technical brewer of Steps: 2 Alternate Level Stairs-Number of Steps: flight Home Layout: Two level;Able to live on main level with bedroom/bathroom        Prior Function Prior Level of Function : Independent/Modified Independent             Mobility Comments: indep ADLs Comments: indep     Hand Dominance   Dominant Hand: Right    Extremity/Trunk Assessment   Upper Extremity Assessment Upper Extremity Assessment: LUE deficits/detail  LUE Deficits / Details: grossly 4-/5 LUE Sensation: decreased light touch ("feels heavy and numb")    Lower Extremity Assessment Lower Extremity Assessment: Overall WFL for tasks assessed    Cervical / Trunk Assessment Cervical / Trunk Assessment: Normal  Communication   Communication: No difficulties  Cognition Arousal/Alertness: Awake/alert Behavior During Therapy: WFL for  tasks assessed/performed Overall Cognitive Status: Within Functional Limits for tasks assessed                                          General Comments General comments (skin integrity, edema, etc.): VSS, educated on "BE FAST", spouse and pt with good understanding    Exercises     Assessment/Plan    PT Assessment Patient needs continued PT services  PT Problem List Decreased strength;Decreased range of motion;Decreased activity tolerance;Decreased balance;Decreased mobility;Decreased coordination       PT Treatment Interventions DME instruction;Gait training;Stair training;Functional mobility training;Therapeutic activities;Therapeutic exercise;Balance training;Neuromuscular re-education    PT Goals (Current goals can be found in the Care Plan section)  Acute Rehab PT Goals Patient Stated Goal: get back to running PT Goal Formulation: With patient Time For Goal Achievement: 02/04/21 Potential to Achieve Goals: Good Additional Goals Additional Goal #1: Pt to score >19 on DGI to indicate minimal falls risk.    Frequency Min 4X/week   Barriers to discharge        Co-evaluation               AM-PAC PT "6 Clicks" Mobility  Outcome Measure Help needed turning from your back to your side while in a flat bed without using bedrails?: None Help needed moving from lying on your back to sitting on the side of a flat bed without using bedrails?: None Help needed moving to and from a bed to a chair (including a wheelchair)?: None Help needed standing up from a chair using your arms (e.g., wheelchair or bedside chair)?: None Help needed to walk in hospital room?: A Little Help needed climbing 3-5 steps with a railing? : A Little 6 Click Score: 22    End of Session Equipment Utilized During Treatment: Gait belt Activity Tolerance: Patient tolerated treatment well Patient left: in chair;with call bell/phone within reach;with family/visitor present Nurse  Communication: Mobility status (pt with report of L LE feeling weird) PT Visit Diagnosis: Unsteadiness on feet (R26.81);Muscle weakness (generalized) (M62.81)    Time: 2353-6144 PT Time Calculation (min) (ACUTE ONLY): 25 min   Charges:   PT Evaluation $PT Eval Moderate Complexity: 1 Mod PT Treatments $Gait Training: 8-22 mins        Kittie Plater, PT, DPT Acute Rehabilitation Services Pager #: [phone] Office #: [phone]   Berline Lopes 01/21/2021, 1:29 PM

## 2021-01-21 NOTE — ED Notes (Signed)
Report called to care link 

## 2021-01-21 NOTE — H&P (Addendum)
History and Physical    Mark Mclaughlin:[id number] DOB: 01-04-1961 DOA: 01/21/2021  Referring MD/NP/PA: Inda Merlin, MD PCP: Caren Macadam, MD  Patient coming from: Transfer from [place]  Chief Complaint: Left arm numbness  I have personally briefly reviewed patient's old medical records in [place]   HPI: Mark Mclaughlin is a 60 y.o. right-handed male with medical history significant of hyperlipidemia who presents with complaints of left arm numbness.  Patient had gone to sleep last night in his normal state of health.  He woke up this morning at 4:15 AM with complaints of left arm numbness and tingling.  He told his wife who suggested that he possibly just slept wrong.  Patient lay down and then got back up at 4:30 AM  and decided to run on his home treadmill instead.  However, reported having significant heaviness feeling in his left arm which was unusual.  Patient notes that he is very active and runs marathons.  He normally wears an apple watch and reports that it is not reported any arrhythmias, but normally when he runs at least for the first mile his heart rate will jump up to 158 prior to coming back down there after.  He just recently had a routine physical exam with his primary care provider 1 month ago where he was told that his cholesterol levels were still elevated and his pravastatin dose was increased.  Patient reports that he previously had leg pains on Lipitor for which it was discontinued.  He denies any family history of stroke.  He is also not on a daily aspirin.  ED Course: Patient presented to the ED as a code stroke.  CT scan of the brain did not note any acute signs of a stroke or hemorrhage, but did note lateral intramedullary thought to be developmental.  Evaluated by telemetry neurology.  Patient was not a candidate for tPA given unknown last normal.  Patient was noted to be afebrile, pulse 55-61, blood pressure elevated up to 159/98, and  all other vital signs maintained.  Labs were relatively unremarkable.  Influenza and COVID-19 screening were negative.  Review of Systems  Constitutional:  Negative for fever and malaise/fatigue.  HENT:  Negative for congestion and hearing loss.   Eyes:  Negative for photophobia and pain.  Respiratory:  Negative for cough and shortness of breath.   Cardiovascular:  Negative for chest pain and leg swelling.  Gastrointestinal:  Negative for abdominal pain, nausea and vomiting.  Genitourinary:  Negative for dysuria and hematuria.  Musculoskeletal:  Negative for falls.  Skin:  Negative for rash.  Neurological:  Positive for sensory change. Negative for loss of consciousness.  Psychiatric/Behavioral:  Negative for substance abuse.    Past Medical History:  Diagnosis Date   Hyperlipidemia     Past Surgical History:  Procedure Laterality Date   ANTERIOR CRUCIATE LIGAMENT REPAIR       reports that he has never smoked. He does not have any smokeless tobacco history on file. He reports that he does not drink alcohol and does not use drugs.  No Known Allergies  Family History  Problem Relation Age of Onset   Cancer Father    Heart attack Paternal Uncle    Heart attack Maternal Grandfather     Prior to Admission medications   Not on File    Physical Exam:  Constitutional: Middle-age male in NAD, calm, comfortable Vitals:   01/21/21 0700 01/21/21 0730 01/21/21 0800 01/21/21 0830  BP: Marland Kitchen)  145/81     Pulse: 60  (!) 55 (!) 55  Resp: 17 18 17 13   Temp:      TempSrc:      SpO2: 97%  96% 97%  Weight:      Height:       Eyes: PERRL, lids and conjunctivae normal ENMT: Mucous membranes are moist. Posterior pharynx clear of any exudate or lesions.Normal dentition.  Neck: normal, supple, no masses, no thyromegaly Respiratory: clear to auscultation bilaterally, no wheezing, no crackles. Normal respiratory effort. No accessory muscle use.  Cardiovascular: Bradycardic, no murmurs /  rubs / gallops. No extremity edema. 2+ pedal pulses. No carotid bruits.  Abdomen: no tenderness, no masses palpated. No hepatosplenomegaly. Bowel sounds positive.  Musculoskeletal: no clubbing / cyanosis. No joint deformity upper and lower extremities. Good ROM, no contractures. Normal muscle tone.  Skin: no rashes, lesions, ulcers. No induration Neurologic: CN 2-12 grossly intact. Sensation intact, DTR normal. Strength 4+/5 in the left upper extremity with slight drift appreciated.  Strength 5/5 in all other extremities Psychiatric: Normal judgment and insight. Alert and oriented x 3. Normal mood.     Labs on Admission: I have personally reviewed following labs and imaging studies  CBC: Recent Labs  Lab 01/21/21 0611  WBC 6.3  NEUTROABS 3.0  HGB 16.4  HCT 48.2  MCV 92.3  PLT 299   Basic Metabolic Panel: Recent Labs  Lab 01/21/21 0611  NA 142  K 4.5  CL 105  CO2 28  GLUCOSE 102*  BUN 15  CREATININE 1.15  CALCIUM 9.8   GFR: Estimated Creatinine Clearance: 73.9 mL/min (by C-G formula based on SCr of 1.15 mg/dL). Liver Function Tests: Recent Labs  Lab 01/21/21 0611  AST 26  ALT 25  ALKPHOS 49  BILITOT 0.6  PROT 7.4  ALBUMIN 4.7   No results for input(s): LIPASE, AMYLASE in the last 168 hours. No results for input(s): AMMONIA in the last 168 hours. Coagulation Profile: Recent Labs  Lab 01/21/21 0611  INR 0.9   Cardiac Enzymes: No results for input(s): CKTOTAL, CKMB, CKMBINDEX, TROPONINI in the last 168 hours. BNP (last 3 results) No results for input(s): PROBNP in the last 8760 hours. HbA1C: No results for input(s): HGBA1C in the last 72 hours. CBG: Recent Labs  Lab 01/21/21 0602  GLUCAP 97   Lipid Profile: No results for input(s): CHOL, HDL, LDLCALC, TRIG, CHOLHDL, LDLDIRECT in the last 72 hours. Thyroid Function Tests: No results for input(s): TSH, T4TOTAL, FREET4, T3FREE, THYROIDAB in the last 72 hours. Anemia Panel: No results for input(s):  VITAMINB12, FOLATE, FERRITIN, TIBC, IRON, RETICCTPCT in the last 72 hours. Urine analysis: No results found for: COLORURINE, APPEARANCEUR, LABSPEC, PHURINE, GLUCOSEU, HGBUR, BILIRUBINUR, KETONESUR, PROTEINUR, UROBILINOGEN, NITRITE, LEUKOCYTESUR Sepsis Labs: Recent Results (from the past 240 hour(s))  Resp Panel by RT-PCR (Flu A&B, Covid) Nasopharyngeal Swab     Status: None   Collection Time: 01/21/21  6:13 AM   Specimen: Nasopharyngeal Swab; Nasopharyngeal(NP) swabs in vial transport medium  Result Value Ref Range Status   SARS Coronavirus 2 by RT PCR NEGATIVE NEGATIVE Final    Comment: (NOTE) SARS-CoV-2 target nucleic acids are NOT DETECTED.  The SARS-CoV-2 RNA is generally detectable in upper respiratory specimens during the acute phase of infection. The lowest concentration of SARS-CoV-2 viral copies this assay can detect is 138 copies/mL. A negative result does not preclude SARS-Cov-2 infection and should not be used as the sole basis for treatment or other patient management decisions. A  negative result may occur with  improper specimen collection/handling, submission of specimen other than nasopharyngeal swab, presence of viral mutation(s) within the areas targeted by this assay, and inadequate number of viral copies(<138 copies/mL). A negative result must be combined with clinical observations, patient history, and epidemiological information. The expected result is Negative.  Fact Sheet for Patients:  EntrepreneurPulse.com.au  Fact Sheet for Healthcare Providers:  IncredibleEmployment.be  This test is no t yet approved or cleared by the [place] FDA and  has been authorized for detection and/or diagnosis of SARS-CoV-2 by FDA under an Emergency Use Authorization (EUA). This EUA will remain  in effect (meaning this test can be used) for the duration of the COVID-19 declaration under Section 564(b)(1) of the Act, 21 U.S.C.section  360bbb-3(b)(1), unless the authorization is terminated  or revoked sooner.       Influenza A by PCR NEGATIVE NEGATIVE Final   Influenza B by PCR NEGATIVE NEGATIVE Final    Comment: (NOTE) The Xpert Xpress SARS-CoV-2/FLU/RSV plus assay is intended as an aid in the diagnosis of influenza from Nasopharyngeal swab specimens and should not be used as a sole basis for treatment. Nasal washings and aspirates are unacceptable for Xpert Xpress SARS-CoV-2/FLU/RSV testing.  Fact Sheet for Patients: EntrepreneurPulse.com.au  Fact Sheet for Healthcare Providers: IncredibleEmployment.be  This test is not yet approved or cleared by the [place] FDA and has been authorized for detection and/or diagnosis of SARS-CoV-2 by FDA under an Emergency Use Authorization (EUA). This EUA will remain in effect (meaning this test can be used) for the duration of the COVID-19 declaration under Section 564(b)(1) of the Act, 21 U.S.C. section 360bbb-3(b)(1), unless the authorization is terminated or revoked.  Performed at [facility], [address], [place], [place] [address]      Radiological Exams on Admission: CT HEAD CODE STROKE WO CONTRAST  Result Date: 01/21/2021 CLINICAL DATA:  Code stroke. Left arm numbness beginning this morning EXAM: CT HEAD WITHOUT CONTRAST TECHNIQUE: Contiguous axial images were obtained from the base of the skull through the vertex without intravenous contrast. COMPARISON:  None. FINDINGS: Brain: No evidence of acute infarction, hemorrhage, hydrocephalus, extra-axial collection or mass lesion/mass effect. Lateral ventriculomegaly, likely developmental. Vascular: No hyperdense vessel or unexpected calcification. Skull: Normal. Negative for fracture or focal lesion. Sinuses/Orbits: No acute finding. Other: These results were called by telephone at the time of interpretation on 01/21/2021 at 6:06 am to provider [facility] ,  who verbally acknowledged these results. ASPECTS [facility] Stroke Program Early CT Score) - Ganglionic level infarction (caudate, lentiform nuclei, internal capsule, insula, M1-M3 cortex): 7 - Supraganglionic infarction (M4-M6 cortex): 3 Total score (0-10 with 10 being normal): 10 IMPRESSION: 1. No acute finding. 2. ASPECTS is 10. Electronically Signed   By: Jorje Guild M.D.   On: 01/21/2021 06:07    EKG: Independently reviewed. Sinus bradycardia at 60 bpm with first-degree heart block  Assessment/Plan CVA: Acute.  Patient presents with acute onset of left numbness and mild weakness of the left arm.  CT scan did not note any acute abnormality, but MRI was significant for a small area of restricted diffusion in the right frontoparietal subcortical white matter consistent with acute infarct.  Last LDL was 112.  Patient had not been a candidate for tPA due to mild symptoms.  Telemetry neurology recommended patient be started on aspirin. -Admit to a medical telemetry bed -Stroke order set utilized -Neuro checks -Check TSH, lipid panel, hemoglobin A1c, and magnesium level -Check echocardiogram -Aspirin  and statin -Allow for permissive hypertension up to [place] neurology consultative services, will follow-up for any further recommendation in regards to imaging and/or medications such as Plavix  First-degree heart block asymptomatic bradycardia: On admission patient noted to have heart rates 55-60s with blood pressures maintained noted on initial EKG, but no priors to compare.  Bradycardia likely secondary to patient's history of running on a regular basis.  Reports heart rates elevated up to 158 when running first mile with exercise, but denies any report of any arrhythmias per his apple watch. -Follow-up echocardiogram and telemetry overnight -Question need of Holter monitor  Hyperlipidemia: Last lipid panel from 12/20/2020 noted total cholesterol 196, HDL 67, LDL 112, triglycerides  94.  Home medications include pravastatin 40 mg daily.  Patient previously intolerant of Lipitor due to leg pains. -Follow-up lipid panel -Goal LDL less than 70 -Continue pravastatin versus consider changing to high intensity statin like Crestor    DVT prophylaxis: Lovenox Code Status: Full Family Communication: Wife updated at bedside Disposition Plan: Likely discharge home once medically stable Consults called: Neurology Admission status: Inpatient, require more than 2 midnight stay the setting of acute stroke Norval Morton MD [facility]   If 7PM-7AM, please contact night-coverage   01/21/2021, 9:36 AM

## 2021-01-21 NOTE — ED Provider Notes (Addendum)
[facility] Provider Note: Georgena Spurling, MD, FACEP  CSN: [id number] MRN: [id number] ARRIVAL: 01/21/21 at [place]  Numbness   HISTORY OF PRESENT ILLNESS  01/21/21 5:52 AM Mark Mclaughlin is a 60 y.o. male who went to bed normal yesterday evening.  He awakened this morning about 4 AM and had numbness (paresthesias but not completely insensate) in his left arm.  He noticed no other numbness and no weakness.  His symptoms abated and he got on the treadmill.  His symptoms he got on the treadmill his symptoms returned and were somewhat worse.  Again he is not entirely insensate in the left upper extremity and he has noticed no motor symptoms.  He denies a headache.  He is able to ambulate without difficulty.  Nothing makes his symptoms better or worse.   Past Medical History:  Diagnosis Date   Hyperlipidemia     History reviewed. No pertinent surgical history.  Family History  Problem Relation Age of Onset   Cancer Father    Heart attack Paternal Uncle    Heart attack Maternal Grandfather     Social History   Tobacco Use   Smoking status: Never  Substance Use Topics   Alcohol use: No   Drug use: No    Prior to Admission medications   Not on File    Allergies Patient has no known allergies.   REVIEW OF SYSTEMS  Negative except as noted here or in the History of Present Illness.   PHYSICAL EXAMINATION  Initial Vital Signs Blood pressure (!) 159/98, pulse 61, temperature 98 F (36.7 C), temperature source Oral, resp. rate 17, height 5\' 8"  (1.727 m), weight 83.9 kg, SpO2 97 %.  Examination General: Well-developed, well-nourished male in no acute distress; appearance consistent with age of record HENT: normocephalic; atraumatic Eyes: pupils equal, round and reactive to light; extraocular muscles intact Neck: supple; no bruit Heart: regular rate and rhythm Lungs: clear to auscultation bilaterally Abdomen: soft;  nondistended; nontender; bowel sounds present Extremities: No deformity; full range of motion; pulses normal Neurologic: Awake, alert and oriented; motor function intact in all extremities and symmetric; sensation decreased in left upper extremity; no facial droop; left pronator drift; normal finger-to-nose; normal speech and gait Skin: Warm and dry Psychiatric: Normal mood and affect   RESULTS  Summary of this visit's results, reviewed and interpreted by myself:   EKG Interpretation  Date/Time:  Tuesday January 21 2021 06:07:15 EST Ventricular Rate:  60 PR Interval:  213 QRS Duration: 81 QT Interval:  372 QTC Calculation: 372 R Axis:   88 Text Interpretation: Sinus rhythm Prolonged PR interval Abnormal T, consider ischemia, lateral leads No previous ECGs available Confirmed by Patrcia Schnepp, Jenny Reichmann [phone]) on 01/21/2021 6:19:07 AM       Laboratory Studies: Results for orders placed or performed during the hospital encounter of 01/21/21 (from the past 24 hour(s))  CBG monitoring, ED     Status: None   Collection Time: 01/21/21  6:02 AM  Result Value Ref Range   Glucose-Capillary 97 70 - 99 mg/dL  Protime-INR     Status: None   Collection Time: 01/21/21  6:11 AM  Result Value Ref Range   Prothrombin Time 12.2 11.4 - 15.2 seconds   INR 0.9 0.8 - 1.2  APTT     Status: None   Collection Time: 01/21/21  6:11 AM  Result Value Ref Range   aPTT 25 24 - 36 seconds  CBC  Status: None   Collection Time: 01/21/21  6:11 AM  Result Value Ref Range   WBC 6.3 4.0 - 10.5 K/uL   RBC 5.22 4.22 - 5.81 MIL/uL   Hemoglobin 16.4 13.0 - 17.0 g/dL   HCT 48.2 39.0 - 52.0 %   MCV 92.3 80.0 - 100.0 fL   MCH 31.4 26.0 - 34.0 pg   MCHC 34.0 30.0 - 36.0 g/dL   RDW 12.0 11.5 - 15.5 %   Platelets 236 150 - 400 K/uL   nRBC 0.0 0.0 - 0.2 %  Differential     Status: None   Collection Time: 01/21/21  6:11 AM  Result Value Ref Range   Neutrophils Relative % 47 %   Neutro Abs 3.0 1.7 - 7.7 K/uL    Lymphocytes Relative 36 %   Lymphs Abs 2.3 0.7 - 4.0 K/uL   Monocytes Relative 12 %   Monocytes Absolute 0.7 0.1 - 1.0 K/uL   Eosinophils Relative 4 %   Eosinophils Absolute 0.3 0.0 - 0.5 K/uL   Basophils Relative 1 %   Basophils Absolute 0.0 0.0 - 0.1 K/uL   Immature Granulocytes 0 %   Abs Immature Granulocytes 0.01 0.00 - 0.07 K/uL   Imaging Studies: CT HEAD CODE STROKE WO CONTRAST  Result Date: 01/21/2021 CLINICAL DATA:  Code stroke. Left arm numbness beginning this morning EXAM: CT HEAD WITHOUT CONTRAST TECHNIQUE: Contiguous axial images were obtained from the base of the skull through the vertex without intravenous contrast. COMPARISON:  None. FINDINGS: Brain: No evidence of acute infarction, hemorrhage, hydrocephalus, extra-axial collection or mass lesion/mass effect. Lateral ventriculomegaly, likely developmental. Vascular: No hyperdense vessel or unexpected calcification. Skull: Normal. Negative for fracture or focal lesion. Sinuses/Orbits: No acute finding. Other: These results were called by telephone at the time of interpretation on 01/21/2021 at 6:06 am to provider [facility] , who verbally acknowledged these results. ASPECTS [facility] Stroke Program Early CT Score) - Ganglionic level infarction (caudate, lentiform nuclei, internal capsule, insula, M1-M3 cortex): 7 - Supraganglionic infarction (M4-M6 cortex): 3 Total score (0-10 with 10 being normal): 10 IMPRESSION: 1. No acute finding. 2. ASPECTS is 10. Electronically Signed   By: Jorje Guild M.D.   On: 01/21/2021 06:07    ED COURSE and MDM  Nursing notes, initial and subsequent vitals signs, including pulse oximetry, reviewed and interpreted by myself.  Vitals:   01/21/21 0608 01/21/21 0630  BP: (!) 159/98 (!) 149/83  Pulse: 61 60  Resp: 17 19  Temp: 98 F (36.7 C)   TempSrc: Oral   SpO2: 97% 97%  Weight: 83.9 kg   Height: 5\' 8"  (1.727 m)    Medications  sodium chloride flush (NS) 0.9 % injection 3 mL (3 mLs  Intravenous Given 01/21/21 0628)   5:52 AM Code stroke called.  6:17 AM Telemetry neurologist agrees the patient likely had a small stroke but advises against thrombolytics as he cannot be sure the patient's symptoms abated 100%, thus making it unclear when he was truly last normal.  6:36 AM Dr. Cyd Silence steps for admission to [facility] hospitalist service.  PROCEDURES  Procedures CRITICAL CARE Performed by: Karen Chafe Orlinda Slomski Total critical care time: 30 minutes Critical care time was exclusive of separately billable procedures and treating other patients. Critical care was necessary to treat or prevent imminent or life-threatening deterioration. Critical care was time spent personally by me on the following activities: development of treatment plan with patient and/or surrogate as well as nursing, discussions with consultants,  evaluation of patient's response to treatment, examination of patient, obtaining history from patient or surrogate, ordering and performing treatments and interventions, ordering and review of laboratory studies, ordering and review of radiographic studies, pulse oximetry and re-evaluation of patient's condition.   ED DIAGNOSES     ICD-10-CM   1. Cerebrovascular accident (CVA), unspecified mechanism ([place])  I63.9     2. Elevated blood pressure reading in office without diagnosis of hypertension  R03.0          Janes Colegrove, MD 01/21/21 0973    Shanon Rosser, MD 01/21/21 [phone]

## 2021-01-21 NOTE — TOC Initial Note (Signed)
Transition of Care [facility]) - Initial/Assessment Note    Patient Details  Name: Mark Mclaughlin MRN: [id number] Date of Birth: Jun 25, 1960  [facility]) CM/SW Contact:    Pollie Friar, RN Phone Number: 01/21/2021, 2:24 PM  Clinical Narrative:                 Patient is from home with spouse that can provide needed supervision.  No DME at home.  No issues with transportation or home medications.  Recommendations for outpatient therapy. Pt prefers to attend at [facility]. Orders in Epic and information on the AVS. TOC following.  Expected Discharge Plan: OP Rehab Barriers to Discharge: Continued Medical Work up   Patient Goals and CMS Choice     Choice offered to / list presented to : Patient, Spouse  Expected Discharge Plan and Services Expected Discharge Plan: OP Rehab   Discharge Planning Services: CM Consult   Living arrangements for the past 2 months: Single Family Home                                      Prior Living Arrangements/Services Living arrangements for the past 2 months: Single Family Home Lives with:: Spouse Patient language and need for interpreter reviewed:: Yes Do you feel safe going back to the place where you live?: Yes        Care giver support system in place?: Yes (comment)   Criminal Activity/Legal Involvement Pertinent to Current Situation/Hospitalization: No - Comment as needed  Activities of Daily Living Home Assistive Devices/Equipment: None ADL Screening (condition at time of admission) Patient's cognitive ability adequate to safely complete daily activities?: Yes Is the patient deaf or have difficulty hearing?: No Does the patient have difficulty seeing, even when wearing glasses/contacts?: No Does the patient have difficulty concentrating, remembering, or making decisions?: No Patient able to express need for assistance with ADLs?: Yes Does the patient have difficulty dressing or bathing?: No Independently  performs ADLs?: Yes (appropriate for developmental age) Does the patient have difficulty walking or climbing stairs?: No Weakness of Legs: None Weakness of Arms/Hands: Left  Permission Sought/Granted                  Emotional Assessment Appearance:: Appears stated age Attitude/Demeanor/Rapport: Other (comment) (distracted) Affect (typically observed): Accepting Orientation: : Oriented to Self, Oriented to Place, Oriented to  Time, Oriented to Situation   Psych Involvement: No (comment)  Admission diagnosis:  Numbness and tingling in left arm [R20.0, R20.2] Cerebrovascular accident (CVA), unspecified mechanism ([place]) [I63.9] Elevated blood pressure reading in office without diagnosis of hypertension [R03.0] CVA (cerebral vascular accident) [facility]) [I63.9] Patient Active Problem List   Diagnosis Date Noted   Numbness and tingling in left arm 01/21/2021   CVA (cerebral vascular accident) ([place]) 01/21/2021   Hyperlipidemia 01/21/2021   First degree heart block 01/21/2021   Bradycardia 01/21/2021   PCP:  Caren Macadam, MD Pharmacy:   [id number] - [place], [place] - [address] [place] [address] Phone: [phone] Fax: [phone]     Social Determinants of Health (SDOH) Interventions    Readmission Risk Interventions No flowsheet data found.

## 2021-01-21 NOTE — Consult Note (Signed)
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 I would recommend aspirin therapy and then stroke evaluation. Recommendations as below. Thank you for the consultation.  Metrics: Last Known Well: 01/20/2021 20:30:00 [facility] Notification Time: 01/21/2021 05:57:04 Arrival Time: 01/21/2021 05:42:00 Stamp Time: 01/21/2021 05:57:04 Initial Response Time: 01/21/2021 05:59:56 Symptoms: Left arm numbness.Marland Kitchen NIHSS Start Assessment Time: 01/21/2021 06:02:35 Patient is not a candidate for Thrombolytic. Thrombolytic Medical Decision: 01/21/2021 06:03:47 Patient was not deemed candidate for Thrombolytic because of following reasons: Last Well Known Above 4.5 Hours.  CT head showed no acute hemorrhage or acute core infarct.  ED Physician notified of diagnostic impression and management plan on 01/21/2021 06:13:21  Advanced Imaging: Advanced Imaging Not Completed because:  No signs or symptoms of LVO.   Our recommendations are outlined below.  Recommendations:        Stroke/Telemetry Floor       Neuro Checks       Bedside Swallow Eval       DVT Prophylaxis       IV Fluids, Normal Saline       Head of Bed 30 Degrees       Euglycemia and Avoid Hyperthermia (PRN Acetaminophen)       Initiate or continue Aspirin 81 MG daily       Antihypertensives PRN if Blood pressure is greater than 220/120 or there is a concern for End organ damage/contraindications for permissive HTN. If blood pressure is greater than 220/120 give labetalol PO or IV or Vasotec IV with a goal of 15% reduction in BP during the first 24 hours.  Routine Consultation with [facility] for Follow up Care  Sign Out:        Discussed with Emergency Department Provider    ------------------------------------------------------------------------------  History of Present Illness: Patient is a 60 year old Male.  Patient was brought by private transportation with symptoms of Left arm numbness..  This is a 60 year old male with a past medical history significant for hyperlipidemia presenting to the emergency department with a last known well time of 2030 when he went to bed. The patient woke up and he noticed that his left arm was feeling numb. He then went to try to run and he noticed that he was still feeling numb at that point. Because of the symptoms, he came into for further evaluation. He states it did get better but he is not sure that it went away completely.   Past Medical History:      Hyperlipidemia  No Anticoagulant use   No Antiplatelet use  Allergies:  Reviewed    Examination: BP(174/94), Pulse(76), Blood Glucose(97) 1A: Level of Consciousness - Alert; keenly responsive + 0 1B: Ask Month and Age - Both Questions Right + 0 1C: Blink Eyes & Squeeze Hands - Performs Both Tasks + 0 2: Test Horizontal Extraocular Movements - Normal + 0 3: Test Visual Fields - No Visual Loss + 0 4: Test Facial Palsy (Use Grimace if Obtunded) - Normal symmetry + 0 5A: Test Left Arm Motor Drift - Drift, but doesn't hit bed + 1 5B: Test Right Arm Motor Drift - No Drift for 10 Seconds + 0  6A: Test Left Leg Motor Drift - No Drift for 5 Seconds + 0 6B: Test Right Leg Motor Drift - No Drift for 5 Seconds + 0 7: Test Limb Ataxia (FNF/Heel-Shin) - No Ataxia + 0 8: Test Sensation - Normal; No sensory loss + 0 9: Test Language/Aphasia - Normal; No aphasia + 0 10: Test Dysarthria - Normal + 0 11: Test Extinction/Inattention - No abnormality + 0  NIHSS Score: 1   Pre-Morbid Modified Rankin Scale: 0 Points = No symptoms at all   Patient/Family was informed the Neurology Consult would occur via TeleHealth  consult by way of interactive audio and video telecommunications and consented to receiving care in this manner.   Patient is being evaluated for possible acute neurologic impairment and high probability of imminent or life-threatening deterioration. I spent total of -325 minutes providing care to this patient, including time for face to face visit via telemedicine, review of medical records, imaging studies and discussion of findings with providers, the patient and/or family.   Dr Kennon Portela   [facility] [phone]  Case [id number]

## 2021-01-22 ENCOUNTER — Inpatient Hospital Stay (HOSPITAL_COMMUNITY): Payer: BC Managed Care – PPO

## 2021-01-22 ENCOUNTER — Observation Stay (HOSPITAL_BASED_OUTPATIENT_CLINIC_OR_DEPARTMENT_OTHER): Payer: BC Managed Care – PPO

## 2021-01-22 DIAGNOSIS — I440 Atrioventricular block, first degree: Secondary | ICD-10-CM | POA: Diagnosis not present

## 2021-01-22 DIAGNOSIS — I639 Cerebral infarction, unspecified: Secondary | ICD-10-CM | POA: Diagnosis not present

## 2021-01-22 DIAGNOSIS — R200 Anesthesia of skin: Secondary | ICD-10-CM | POA: Diagnosis not present

## 2021-01-22 DIAGNOSIS — R202 Paresthesia of skin: Secondary | ICD-10-CM

## 2021-01-22 DIAGNOSIS — R001 Bradycardia, unspecified: Secondary | ICD-10-CM | POA: Diagnosis not present

## 2021-01-22 LAB — RAPID URINE DRUG SCREEN, HOSP PERFORMED
Amphetamines: NOT DETECTED
Barbiturates: NOT DETECTED
Benzodiazepines: NOT DETECTED
Cocaine: NOT DETECTED
Opiates: NOT DETECTED
Tetrahydrocannabinol: NOT DETECTED

## 2021-01-22 MED ORDER — PRAVASTATIN SODIUM 80 MG PO TABS: 80.0000 mg | ORAL_TABLET | Freq: Every day | ORAL | 2 refills | Status: DC

## 2021-01-22 MED ORDER — ASPIRIN 81 MG PO TBEC: 81.0000 mg | DELAYED_RELEASE_TABLET | Freq: Every day | ORAL | 11 refills | Status: AC

## 2021-01-22 MED ORDER — CLOPIDOGREL BISULFATE 75 MG PO TABS: 75.0000 mg | ORAL_TABLET | Freq: Every day | ORAL | 0 refills | Status: AC

## 2021-01-22 MED ORDER — PRAVASTATIN SODIUM 40 MG PO TABS: 80.0000 mg | ORAL_TABLET | Freq: Every day | ORAL | Status: DC

## 2021-01-22 NOTE — Progress Notes (Signed)
TCD bubble and lower extremity venous has been completed.   Preliminary results in CV Proc.   Mark Mclaughlin 01/22/2021 2:06 PM

## 2021-01-22 NOTE — Evaluation (Signed)
Speech Language Pathology Evaluation Patient Details Name: Mark Mclaughlin MRN: [id number] DOB: October 03, 1960 Today's Date: 01/22/2021 Time: 6195-0932 SLP Time Calculation (min) (ACUTE ONLY): 14 min  Problem List:  Patient Active Problem List   Diagnosis Date Noted   Numbness and tingling in left arm 01/21/2021   CVA (cerebral vascular accident) ([place]) 01/21/2021   Hyperlipidemia 01/21/2021   First degree heart block 01/21/2021   Bradycardia 01/21/2021   Past Medical History:  Past Medical History:  Diagnosis Date   Hyperlipidemia    Past Surgical History:  Past Surgical History:  Procedure Laterality Date   ANTERIOR CRUCIATE LIGAMENT REPAIR     HPI:  Pt is a 60yo male who came to ED due to L UE feeling numb and heavy. MRI was significant for a small area of restricted diffusion in the right frontoparietal subcortical white matter consistent with acute infarct.   Assessment / Plan / Recommendation Clinical Impression  Pt participated in cognitive-linguistic assessment that revealed fluent, clear speech; normal expressive and receptive language; normal recall, attention, and orientation.  Pt had difficulty with the hand-setting portion of the clock-drawing task.  This was the only error noted on cognitive assessment. Otherwise all subportions of assessment were WNL.  No SLP f/u is warranted. Our service will sign off.    SLP Assessment  SLP Recommendation/Assessment: Patient does not need any further Speech [facility] SLP Visit Diagnosis: Cognitive communication deficit (R41.841)    Recommendations for follow up therapy are one component of a multi-disciplinary discharge planning process, led by the attending physician.  Recommendations may be updated based on patient status, additional functional criteria and insurance authorization.    Follow Up Recommendations  No SLP follow up    Assistance Recommended at Discharge  None  Functional Status Assessment     Frequency and Duration           SLP Evaluation Cognition  Overall Cognitive Status: Within Functional Limits for tasks assessed Arousal/Alertness: Awake/alert Orientation Level: Oriented X4 Attention: Alternating Alternating Attention: Appears intact Memory: Appears intact Awareness: Appears intact Problem Solving: Impaired Problem Solving Impairment: Other (comment) (clockdrawing) Safety/Judgment: Appears intact       Comprehension  Auditory Comprehension Overall Auditory Comprehension: Appears within functional limits for tasks assessed Visual Recognition/Discrimination Discrimination: Within Function Limits Reading Comprehension Reading Status: Within funtional limits    Expression Expression Primary Mode of Expression: Verbal Verbal Expression Overall Verbal Expression: Appears within functional limits for tasks assessed Written Expression Dominant Hand: Right Written Expression: Within Functional Limits   Oral / Motor  Oral Motor/Sensory Function Overall Oral Motor/Sensory Function: Within functional limits Motor Speech Overall Motor Speech: Appears within functional limits for tasks assessed   GO                   Preslei Blakley L. Tivis Ringer, [place] CCC/SLP Acute Rehabilitation Services Office number [phone] Pager [phone]  Juan Quam Laurice 01/22/2021, 1:47 PM

## 2021-01-22 NOTE — Progress Notes (Signed)
Patient had recent complete echo 01/21/21.  Please contact echo department if repeat echo is needed.

## 2021-01-22 NOTE — Progress Notes (Addendum)
Pt discharged home at this time with his spouse.  Verbalizes understanding of all discharge instructions, including follow up appointments, and where to pick up prescriptions.  He has all belongings with him.   Emmi information [email or web address]

## 2021-01-22 NOTE — TOC Transition Note (Signed)
Transition of Care [facility]) - CM/SW Discharge Note   Patient Details  Name: Moris Ratchford MRN: [id number] Date of Birth: 02-26-1961  [facility] CM/SW Contact:  Pollie Friar, RN Phone Number: 01/22/2021, 2:34 PM   Clinical Narrative:    Patient is discharging home with self care. Per therapies he doesn't need outpatient therapy. CM has called and cancelled his referral to [facility].  Pts spouse to provide transport home.   Final next level of care: Home/Self Care Barriers to Discharge: No Barriers Identified   Patient Goals and CMS Choice     Choice offered to / list presented to : Patient, Spouse  Discharge Placement                       Discharge Plan and Services   Discharge Planning Services: CM Consult                                 Social Determinants of Health (SDOH) Interventions     Readmission Risk Interventions No flowsheet data found.

## 2021-01-22 NOTE — Consult Note (Signed)
Stroke Neurology Consultation Note  Consult Requested by: Dr. Quinn Axe   Reason for Consult: Stroke   Consult Date:  01/22/21  The history was obtained from the patient.   History of Present Illness:  Yisroel Mullendore is an 60 y.o. caucasian male with PMH of patient presented with acute onset of left numbness and mild weakness of the left arm to MCDB. CT scan did not note any acute abnormality. TNK not administered 2/2 low NIHSS. Patient was seen in consultation by teleneurology this AM and transferred to [facility] for stroke workup. Today patient reports numbness and weakness and  heaviness in LUE has improved. Some numbness persists in his left palm.  We discussed his stroke diagnosis, ongoing work up and plan of care. His questions were answered. We discussed lifestyle changes.  Date last known well: 01/20/21 Time last known well: Time: 22:32 tPA Given: No:  MRS:   NIHSS:  0 He denies any prior history of strokes, TIA, migraines or significant neurological problems.  He is very active and runs regularly.  Denies any prior, shortness of breath, previous cardiac disease, heart attacks or stents Past Medical History:  Diagnosis Date   Hyperlipidemia      Past Surgical History:  Procedure Laterality Date   ANTERIOR CRUCIATE LIGAMENT REPAIR     Family History  Problem Relation Age of Onset   Cancer Father    Heart attack Maternal Grandfather    Heart attack Paternal Uncle      Social History:  reports that he has never smoked. He does not have any smokeless tobacco history on file. He reports that he does not drink alcohol and does not use drugs.  Review of Systems: A full ROS was attempted today and was   able to be performed.  Systems assessed include - Constitutional, Eyes, HENT, Respiratory, Cardiovascular, Gastrointestinal, Genitourinary, Integument/breast, Hematologic/lymphatic, Musculoskeletal, Neurological, Behavioral/Psych, Endocrine, Allergic/Immunologic - with pertinent responses  as per HPI.  Allergies:  Allergies  Allergen Reactions   Atorvastatin Other (See Comments)    Medications: I have reviewed the patient's current medications. Prior to Admission:  No medications prior to admission.    Test Results: CBC:  Recent Labs  Lab 01/21/21 0611  WBC 6.3  NEUTROABS 3.0  HGB 16.4  HCT 48.2  MCV 92.3  PLT 829   Basic Metabolic Panel:  Recent Labs  Lab 01/21/21 0611 01/21/21 1221  NA 142  --   K 4.5  --   CL 105  --   CO2 28  --   GLUCOSE 102*  --   BUN 15  --   CREATININE 1.15  --   CALCIUM 9.8  --   MG  --  1.9   Liver Function Tests: Recent Labs  Lab 01/21/21 0611  AST 26  ALT 25  ALKPHOS 49  BILITOT 0.6  PROT 7.4  ALBUMIN 4.7   No results for input(s): LIPASE, AMYLASE in the last 168 hours. No results for input(s): AMMONIA in the last 168 hours. Coagulation Studies:  Recent Labs    01/21/21 0611  LABPROT 12.2  INR 0.9   Cardiac Enzymes: No results for input(s): CKTOTAL, CKMB, CKMBINDEX, TROPONINI in the last 168 hours. BNP: Invalid input(s): POCBNP CBG:  Recent Labs  Lab 01/21/21 0602  GLUCAP 97   Urinalysis: No results for input(s): COLORURINE, LABSPEC, PHURINE, GLUCOSEU, HGBUR, BILIRUBINUR, KETONESUR, PROTEINUR, UROBILINOGEN, NITRITE, LEUKOCYTESUR in the last 168 hours.  Invalid input(s): [place] Microbiology:  Results for orders placed or performed during  the hospital encounter of 01/21/21  Resp Panel by RT-PCR (Flu A&B, Covid) Nasopharyngeal Swab     Status: None   Collection Time: 01/21/21  6:13 AM   Specimen: Nasopharyngeal Swab; Nasopharyngeal(NP) swabs in vial transport medium  Result Value Ref Range Status   SARS Coronavirus 2 by RT PCR NEGATIVE NEGATIVE Final    Comment: (NOTE) SARS-CoV-2 target nucleic acids are NOT DETECTED.  The SARS-CoV-2 RNA is generally detectable in upper respiratory specimens during the acute phase of infection. The lowest concentration of SARS-CoV-2 viral copies this assay  can detect is 138 copies/mL. A negative result does not preclude SARS-Cov-2 infection and should not be used as the sole basis for treatment or other patient management decisions. A negative result may occur with  improper specimen collection/handling, submission of specimen other than nasopharyngeal swab, presence of viral mutation(s) within the areas targeted by this assay, and inadequate number of viral copies(<138 copies/mL). A negative result must be combined with clinical observations, patient history, and epidemiological information. The expected result is Negative.  Fact Sheet for Patients:  EntrepreneurPulse.com.au  Fact Sheet for Healthcare Providers:  IncredibleEmployment.be  This test is no t yet approved or cleared by the [place] FDA and  has been authorized for detection and/or diagnosis of SARS-CoV-2 by FDA under an Emergency Use Authorization (EUA). This EUA will remain  in effect (meaning this test can be used) for the duration of the COVID-19 declaration under Section 564(b)(1) of the Act, 21 U.S.C.section 360bbb-3(b)(1), unless the authorization is terminated  or revoked sooner.       Influenza A by PCR NEGATIVE NEGATIVE Final   Influenza B by PCR NEGATIVE NEGATIVE Final    Comment: (NOTE) The Xpert Xpress SARS-CoV-2/FLU/RSV plus assay is intended as an aid in the diagnosis of influenza from Nasopharyngeal swab specimens and should not be used as a sole basis for treatment. Nasal washings and aspirates are unacceptable for Xpert Xpress SARS-CoV-2/FLU/RSV testing.  Fact Sheet for Patients: EntrepreneurPulse.com.au  Fact Sheet for Healthcare Providers: IncredibleEmployment.be  This test is not yet approved or cleared by the [place] FDA and has been authorized for detection and/or diagnosis of SARS-CoV-2 by FDA under an Emergency Use Authorization (EUA). This EUA will  remain in effect (meaning this test can be used) for the duration of the COVID-19 declaration under Section 564(b)(1) of the Act, 21 U.S.C. section 360bbb-3(b)(1), unless the authorization is terminated or revoked.  Performed at [facility], [place], [place], [place] [address]    Lipid Panel:     Component Value Date/Time   CHOL 205 (H) 01/21/2021 1221   TRIG 58 01/21/2021 1221   HDL 66 01/21/2021 1221   CHOLHDL 3.1 01/21/2021 1221   VLDL 12 01/21/2021 1221   LDLCALC 127 (H) 01/21/2021 1221   HgbA1c:  Lab Results  Component Value Date   HGBA1C 5.3 01/21/2021   Urine Drug Screen:     Component Value Date/Time   LABOPIA NONE DETECTED 01/22/2021 0740   COCAINSCRNUR NONE DETECTED 01/22/2021 0740   LABBENZ NONE DETECTED 01/22/2021 0740   AMPHETMU NONE DETECTED 01/22/2021 0740   THCU NONE DETECTED 01/22/2021 0740   LABBARB NONE DETECTED 01/22/2021 0740    Alcohol Level: No results for input(s): ETH in the last 168 hours.  MR ANGIO HEAD WO CONTRAST  Result Date: 01/21/2021 CLINICAL DATA:  Neuro deficit, acute, stroke suspected; Carotid stenosis screening, risk factors; acute stroke on MRI brain EXAM: MRA NECK WITHOUT AND WITH CONTRAST MRA  HEAD WITHOUT CONTRAST TECHNIQUE: Multiplanar and multiecho pulse sequences of the neck were obtained without and with intravenous contrast. Angiographic images of the neck were obtained using MRA technique without and with intravenous contrast; Angiographic images of the Circle of Willis were obtained using MRA technique without intravenous contrast. CONTRAST:  8 mL Gadavist COMPARISON:  None. FINDINGS: MRA NECK FINDINGS Great vessel origins are patent. Common, internal, and external carotid arteries are patent. Extracranial vertebral arteries are patent. Right vertebral artery is dominant. No hemodynamically significant stenosis or evidence of dissection. MRA HEAD FINDINGS Intracranial internal carotid arteries are  patent. Anterior and middle cerebral arteries are patent. Anterior communicating artery is present. Intracranial vertebral arteries are patent. Right vertebral artery is dominant. Basilar artery is patent. Right posterior communicating artery is present. Possible left posterior communicating artery. Posterior cerebral arteries are patent. No significant stenosis or aneurysm. IMPRESSION: No large vessel occlusion, hemodynamically significant stenosis, or evidence of dissection. Electronically Signed   By: Macy Mis M.D.   On: 01/21/2021 17:08   MR ANGIO NECK W WO CONTRAST  Result Date: 01/21/2021 CLINICAL DATA:  Neuro deficit, acute, stroke suspected; Carotid stenosis screening, risk factors; acute stroke on MRI brain EXAM: MRA NECK WITHOUT AND WITH CONTRAST MRA HEAD WITHOUT CONTRAST TECHNIQUE: Multiplanar and multiecho pulse sequences of the neck were obtained without and with intravenous contrast. Angiographic images of the neck were obtained using MRA technique without and with intravenous contrast; Angiographic images of the Circle of Willis were obtained using MRA technique without intravenous contrast. CONTRAST:  8 mL Gadavist COMPARISON:  None. FINDINGS: MRA NECK FINDINGS Great vessel origins are patent. Common, internal, and external carotid arteries are patent. Extracranial vertebral arteries are patent. Right vertebral artery is dominant. No hemodynamically significant stenosis or evidence of dissection. MRA HEAD FINDINGS Intracranial internal carotid arteries are patent. Anterior and middle cerebral arteries are patent. Anterior communicating artery is present. Intracranial vertebral arteries are patent. Right vertebral artery is dominant. Basilar artery is patent. Right posterior communicating artery is present. Possible left posterior communicating artery. Posterior cerebral arteries are patent. No significant stenosis or aneurysm. IMPRESSION: No large vessel occlusion, hemodynamically  significant stenosis, or evidence of dissection. Electronically Signed   By: Macy Mis M.D.   On: 01/21/2021 17:08   MR BRAIN WO CONTRAST  Result Date: 01/21/2021 CLINICAL DATA:  Stroke follow-up.  Left arm numbness. EXAM: MRI HEAD WITHOUT CONTRAST TECHNIQUE: Multiplanar, multiecho pulse sequences of the brain and surrounding structures were obtained without intravenous contrast. COMPARISON:  Head CT January 21, 2021. FINDINGS: Brain: Subtle area of restricted diffusion in the right perirolandic subcortical white matter, suggesting acute infarct. There is faint corresponding T2 hyperintensity. No hemorrhage, extra-axial collection or mass lesion. Mild ventriculomegaly, particularly at the atria and occipital horns, may be developmental versus sequela periventricular leukomalacia. Small amount of scattered foci of T2 hyperintensity are seen within the white matter of the cerebral hemispheres, nonspecific, most likely related to early chronic microangiopathy. Vascular: Normal flow voids. Skull and upper cervical spine: Normal marrow signal. Sinuses/Orbits: Negative. Other: None. IMPRESSION: 1. Small area of restricted diffusion in the right frontoparietal (perirolandic) subcortical white matter consistent with acute infarct. 2. Mild chronic microvascular ischemic changes of the white matter. Electronically Signed   By: Pedro Earls M.D.   On: 01/21/2021 12:13   ECHOCARDIOGRAM COMPLETE  Result Date: 01/21/2021    ECHOCARDIOGRAM REPORT   Patient Name:   TREMON SAINVIL Date of Exam: 01/21/2021 Medical Rec #:  [id number]  Height:       68.0 in Accession #:    [id number]       Weight:       185.0 lb Date of Birth:  11-11-1960        BSA:          1.977 m Patient Age:    89 years         BP:           135/81 mmHg Patient Gender: M                HR:           53 bpm. Exam Location:  Inpatient Procedure: 2D Echo, Cardiac Doppler and Color Doppler Indications:    Stroke  History:         Patient has no prior history of Echocardiogram examinations.  Sonographer:    Merrie Roof RDCS Referring Phys: [id number] [place]  1. Left ventricular ejection fraction, by estimation, is 55 to 60%. The left ventricle has normal function. The left ventricle has no regional wall motion abnormalities. There is mild left ventricular hypertrophy. Left ventricular diastolic parameters are consistent with Grade I diastolic dysfunction (impaired relaxation).  2. Right ventricular systolic function is normal. The right ventricular size is normal. Tricuspid regurgitation signal is inadequate for assessing PA pressure.  3. Left atrial size was mild to moderately dilated.  4. The mitral valve is normal in structure. Trivial mitral valve regurgitation. No evidence of mitral stenosis.  5. The aortic valve is tricuspid. Aortic valve regurgitation is not visualized. No aortic stenosis is present.  6. The inferior vena cava is normal in size with greater than 50% respiratory variability, suggesting right atrial pressure of 3 mmHg. FINDINGS  Left Ventricle: Left ventricular ejection fraction, by estimation, is 55 to 60%. The left ventricle has normal function. The left ventricle has no regional wall motion abnormalities. The left ventricular internal cavity size was normal in size. There is  mild left ventricular hypertrophy. Left ventricular diastolic parameters are consistent with Grade I diastolic dysfunction (impaired relaxation). Right Ventricle: The right ventricular size is normal. No increase in right ventricular wall thickness. Right ventricular systolic function is normal. Tricuspid regurgitation signal is inadequate for assessing PA pressure. Left Atrium: Left atrial size was mild to moderately dilated. Right Atrium: Right atrial size was normal in size. Pericardium: There is no evidence of pericardial effusion. Mitral Valve: The mitral valve is normal in structure. Trivial mitral valve regurgitation. No  evidence of mitral valve stenosis. Tricuspid Valve: The tricuspid valve is normal in structure. Tricuspid valve regurgitation is not demonstrated. Aortic Valve: The aortic valve is tricuspid. Aortic valve regurgitation is not visualized. No aortic stenosis is present. Aortic valve mean gradient measures 4.0 mmHg. Aortic valve peak gradient measures 9.1 mmHg. Aortic valve area, by VTI measures 2.96 cm. Pulmonic Valve: The pulmonic valve was normal in structure. Pulmonic valve regurgitation is not visualized. Aorta: The aortic root is normal in size and structure. Venous: The inferior vena cava is normal in size with greater than 50% respiratory variability, suggesting right atrial pressure of 3 mmHg. IAS/Shunts: No atrial level shunt detected by color flow Doppler.  LEFT VENTRICLE PLAX 2D LVIDd:         4.60 cm   Diastology LVIDs:         3.10 cm   LV e' medial:    9.44 cm/s LV PW:         1.10  cm   LV E/e' medial:  5.4 LV IVS:        1.10 cm   LV e' lateral:   9.36 cm/s LVOT diam:     2.00 cm   LV E/e' lateral: 5.4 LV SV:         84 LV SV Index:   43 LVOT Area:     3.14 cm  RIGHT VENTRICLE RV Basal diam:  3.40 cm LEFT ATRIUM             Index        RIGHT ATRIUM           Index LA diam:        4.40 cm 2.23 cm/m   RA Area:     15.10 cm LA Vol (A2C):   52.2 ml 26.40 ml/m  RA Volume:   34.50 ml  17.45 ml/m LA Vol (A4C):   82.9 ml 41.93 ml/m LA Biplane Vol: 66.5 ml 33.63 ml/m  AORTIC VALVE AV Area (Vmax):    3.18 cm AV Area (Vmean):   3.14 cm AV Area (VTI):     2.96 cm AV Vmax:           151.00 cm/s AV Vmean:          89.800 cm/s AV VTI:            0.284 m AV Peak Grad:      9.1 mmHg AV Mean Grad:      4.0 mmHg LVOT Vmax:         153.00 cm/s LVOT Vmean:        89.800 cm/s LVOT VTI:          0.268 m LVOT/AV VTI ratio: 0.94  AORTA Ao Root diam: 3.10 cm Ao Asc diam:  2.90 cm MITRAL VALVE MV Area (PHT): 3.65 cm    SHUNTS MV Decel Time: 208 msec    Systemic VTI:  0.27 m MV E velocity: 51.00 cm/s  Systemic Diam:  2.00 cm MV A velocity: 60.00 cm/s MV E/A ratio:  0.85 Dalton McleanMD Electronically signed by Franki Monte Signature Date/Time: 01/21/2021/1:39:46 PM    Final    CT HEAD CODE STROKE WO CONTRAST  Result Date: 01/21/2021 CLINICAL DATA:  Code stroke. Left arm numbness beginning this morning EXAM: CT HEAD WITHOUT CONTRAST TECHNIQUE: Contiguous axial images were obtained from the base of the skull through the vertex without intravenous contrast. COMPARISON:  None. FINDINGS: Brain: No evidence of acute infarction, hemorrhage, hydrocephalus, extra-axial collection or mass lesion/mass effect. Lateral ventriculomegaly, likely developmental. Vascular: No hyperdense vessel or unexpected calcification. Skull: Normal. Negative for fracture or focal lesion. Sinuses/Orbits: No acute finding. Other: These results were called by telephone at the time of interpretation on 01/21/2021 at 6:06 am to provider [facility] , who verbally acknowledged these results. ASPECTS [facility] Stroke Program Early CT Score) - Ganglionic level infarction (caudate, lentiform nuclei, internal capsule, insula, M1-M3 cortex): 7 - Supraganglionic infarction (M4-M6 cortex): 3 Total score (0-10 with 10 being normal): 10 IMPRESSION: 1. No acute finding. 2. ASPECTS is 10. Electronically Signed   By: Jorje Guild M.D.   On: 01/21/2021 06:07      Physical Examination: Temp:  [97.5 F (36.4 C)-97.9 F (36.6 C)] 97.7 F (36.5 C) (11/23 0758) Pulse Rate:  [51-53] 53 (11/23 0758) Resp:  [14-18] 14 (11/23 0758) BP: (121-130)/(78-87) 130/87 (11/23 0758) SpO2:  [94 %-97 %] 96 % (11/23 0434)  General - Well nourished, well developed, pleasant  middle-age Caucasian male in no apparent distress   Ophthalmologic - fundi not visualized due to noncooperation.  Cardiovascular - Regular rate and rhythm no murmur or gallop.   Neurological Exam ;  Awake  Alert oriented x 3. Normal speech and language.eye movements full without nystagmus.fundi were  not visualized. Vision acuity and fields appear normal. Hearing is normal. Palatal movements are normal. Face symmetric. Tongue midline. Normal strength, tone, reflexes and coordination. Normal sensation.  Except mild paresthesias in the left hand over the ulnar eminence  gait deferred.  NIH 0  Assessment:  Mr. Cayleb Jarnigan is a 60 y.o. male with history of   Right frontal subcortical lacunar stroke likely from small vessel disease  CT head:  No acute finding. ASPECTS is 10. MRI   Small area of restricted diffusion in the right frontoparietal (perirolandic) subcortical white matter consistent with acute infarct. Mild chronic microvascular ischemic changes of the white matter. MRA   No large vessel occlusion, hemodynamically significant stenosis, or evidence of dissection. Carotid Doppler:  2D Echo: EF 55-60%. Mild LVH. There is mild left ventricular hypertrophy. Left ventricular diastolic parameters  are consistent with Grade I diastolic dysfunction (impaired relaxation).  LDL 127 HgbA1c 5.3  VTE prophylaxis: No  Diet Order             Diet Heart Room service appropriate? Yes; Fluid consistency: Thin  Diet effective now                   Not on anticoagulant or antiplatelet prior to admission  DAPT with ASA 81mg , Plavix 75mg  x 3 weeks then ASA 81mg  alone.   Therapy recommendations:  Outpatient PT Disposition:    Hypertension Stable Permissive hypertension (OK if < 220/120) but gradually normalize in 5-7 days Long-term BP goal normotensive Management per primary team   Hyperlipidemia Home meds:  Pravastatin 40mg  increased to 80 mg for high intensity dose.  LDL 127, goal < 70 Continue statin at discharge  Other Stroke Risk Factors Obesity, Body mass index is 28.13 kg/m., recommend weight loss, diet and exercise as appropriate  At risk for Obstructive sleep apnea  Other Active Problems  Hospital day # 1  Thank you for this consultation and allowing [place] to  participate in the care of this patient. Charlene Brooke, NP-C  STROKE MD NOTE :  I have personally obtained history,examined this patient, reviewed notes, independently viewed imaging studies, participated in medical decision making and plan of care.ROS completed by me personally and pertinent positives fully documented  I have made any additions or clarifications directly to the above note. Agree with note above.  Patient presented with sudden onset of left-sided numbness and presented outside thrombolytic window and MRI scan shows 1 to 1.5 cm right frontal subcortical infarct etiology possibly small vessel disease or he does not have significant vascular risk factors.  Neurological exam was mostly unremarkable except for mild residual paresthesias in the hand.  Neurovascular imaging is negative echocardiogram is unremarkable.  MRI scan shows an isolated large 1.5 cm right frontal subcortical infarct which is typically larger than the traditional in the colon and could be embolic.  Recommend further evaluation with checking TCD bubble study for PFO.  Aspirin Plavix for 3 weeks followed by aspirin alone.  Statin for elevated lipids.  Patient is keen to go home and may need further work-up as an outpatient with TEE.  Follow-up in stroke clinic in 2 months.  He already has an appointment with Dr. Gwenlyn Found cardiologist  on December 10 with advised to keep that appointment.  Long discussion with patient at the bedside and also spoke to his wife over the phone.  Discussed with Dr. [place] Pink.  Greater than 50% time during which 80-minute consultation visit was spent on counseling and coordination of care about a stroke and discussion of stroke prevention and evaluation and treatment and answering questions.  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  Lower  extremity venous Doppler study was also obtained which was negative for DVT.  He has a rope score of 6 with only 62% chance that his stroke is related to PFO.  He will need a TEE to further evaluate PFO anatomy characteristics and if high risk features are found may consider elective endovascular PFO closure.  Discussed with patient, wife and Dr. [place] Pink. Antony Contras MD To contact Stroke Continuity provider, please refer to [email or web address] After hours, contact General Neurology

## 2021-01-22 NOTE — Progress Notes (Signed)
Physical Therapy Treatment Patient Details Name: Mark Mclaughlin MRN: [id number] DOB: 09/10/1960 Today's Date: 01/22/2021   History of Present Illness Pt is a 60yo male who came to ED due to L UE feeling numb and heavy. PMH: hyperlipidemia.    PT Comments    Patient progressing and able to demonstrate low fall risk with 22/24 on DGI.  Feel stable and no current follow up PT needs.  Educated on safety tips for home for fall prevention.  PT to sign off.    Recommendations for follow up therapy are one component of a multi-disciplinary discharge planning process, led by the attending physician.  Recommendations may be updated based on patient status, additional functional criteria and insurance authorization.  Follow Up Recommendations  No PT follow up     Assistance Recommended at Discharge None  Equipment Recommendations  None recommended by PT    Recommendations for Other Services       Precautions / Restrictions Precautions Precautions: None     Mobility  Bed Mobility Overal bed mobility: Independent                  Transfers Overall transfer level: Independent     Sit to Stand: Independent                Ambulation/Gait Ambulation/Gait assistance: Independent Gait Distance (Feet): 200 Feet Assistive device: None Gait Pattern/deviations: WFL(Within Functional Limits)       General Gait Details: see DGI   Stairs Stairs: Yes Stairs assistance: Modified independent (Device/Increase time) Stair Management: One rail Right;Alternating pattern Number of Stairs: 10     Wheelchair Mobility    Modified Rankin (Stroke Patients Only) Modified Rankin (Stroke Patients Only) Pre-Morbid Rankin Score: No symptoms Modified Rankin: No significant disability     Balance Overall balance assessment: Independent                               Standardized Balance Assessment Standardized Balance Assessment : Dynamic Gait Index   Dynamic  Gait Index Level Surface: Normal Change in Gait Speed: Normal Gait with Horizontal Head Turns: Normal Gait with Vertical Head Turns: Mild Impairment Gait and Pivot Turn: Normal Step Over Obstacle: Normal Step Around Obstacles: Normal Steps: Mild Impairment Total Score: 22      Cognition Arousal/Alertness: Awake/alert Behavior During Therapy: WFL for tasks assessed/performed Overall Cognitive Status: Within Functional Limits for tasks assessed                                          Exercises      General Comments General comments (skin integrity, edema, etc.): Educated on fall risk reduction tips for home.      Pertinent Vitals/Pain Pain Assessment: No/denies pain    Home Living                          Prior Function            PT Goals (current goals can now be found in the care plan section) Progress towards PT goals: Goals met/education completed, patient discharged from PT    Frequency    Min 4X/week      PT Plan Discharge plan needs to be updated    Co-evaluation                AM-PAC PT "6 Clicks" Mobility   Outcome Measure  Help needed turning from your back to your side while in a flat bed without using bedrails?: None Help needed moving from lying on your back to sitting on the side of a flat bed without using bedrails?: None Help needed moving to and from a bed to a chair (including a wheelchair)?: None Help needed standing up from a chair using your arms (e.g., wheelchair or bedside chair)?: None Help needed to walk in hospital room?: None Help needed climbing 3-5 steps with a railing? : None 6 Click Score: 24    End of Session   Activity Tolerance: Patient tolerated treatment well Patient left: in chair   PT Visit Diagnosis: Unsteadiness on feet (R26.81)     Time: 1017-1030 PT Time Calculation (min) (ACUTE ONLY): 13 min  Charges:  $Therapeutic Activity: 8-22 mins                     Magda Kiel,  PT Acute Rehabilitation Services Pager:[phone] Office:[phone] 01/22/2021    Reginia Naas 01/22/2021, 10:56 AM

## 2021-01-22 NOTE — Discharge Summary (Signed)
[facility]  Physician Discharge Summary   Patient ID: Mark Mclaughlin MRN: [id number] DOB/AGE: 03/31/1960 60 y.o.  Admit date: 01/21/2021 Discharge date: 01/22/2021    PCP: Caren Macadam, MD  DISCHARGE DIAGNOSES:  Acute stroke Hyperlipidemia  RECOMMENDATIONS FOR OUTPATIENT FOLLOW UP: TEE to be done in the outpatient setting.  Message sent to cardiology. Follow-up with neurology in 8 weeks   Home Health: None Equipment/Devices: None  CODE STATUS: Full code  DISCHARGE CONDITION: fair  Diet recommendation: Heart healthy  INITIAL HISTORY: 60 y.o. right-handed male with medical history significant of hyperlipidemia who presents with complaints of left arm numbness.  Patient had gone to sleep last night in his normal state of health.  He woke up this morning at 4:15 AM with complaints of left arm numbness and tingling.  He told his wife who suggested that he possibly just slept wrong.  Patient lay down and then got back up at 4:30 AM  and decided to run on his home treadmill instead.  However, reported having significant heaviness feeling in his left arm which was unusual.  Patient notes that he is very active and runs marathons.  He normally wears an apple watch and reports that it is not reported any arrhythmias, but normally when he runs at least for the first mile his heart rate will jump up to 158 prior to coming back down there after.  He just recently had a routine physical exam with his primary care provider 1 month ago where he was told that his cholesterol levels were still elevated and his pravastatin dose was increased.  Patient reports that he previously had leg pains on Lipitor for which it was discontinued.  He denies any family history of stroke.  He is also not on a daily aspirin.   ED Course: Patient presented to the ED as a code stroke.  CT scan of the brain did not note any acute signs of a stroke or hemorrhage, but did note lateral intramedullary thought to  be developmental.  Evaluated by telemetry neurology.  Patient was not a candidate for tPA given unknown last normal.  Patient was noted to be afebrile, pulse 55-61, blood pressure elevated up to 159/98, and all other vital signs maintained.  Labs were relatively unremarkable.  Influenza and COVID-19 screening were negative.  Consultations: Neurology  Procedures: Transthoracic echocardiogram Lower extremity Doppler   HOSPITAL COURSE:   Acute stroke Patient presented with numbness and weakness of the left arm.  MRI showed stroke in the right frontoparietal area.  Patient was seen by neurology.  LDL was 127.  HbA1c 5.3.  TSH 1.19.  HIV was nonreactive.  Urine drug screen was negative.  Patient underwent MR a head and neck.   Pravastatin dose was increased. Underwent echocardiogram which showed normal systolic function.  Grade 1 diastolic dysfunction noted.  No significant valvular abnormalities noted.  Bubble study showed shunt.  Underwent lower extremity Dopplers which did not show any DVT. Patient will be discharged on aspirin and Plavix for 3 weeks followed by aspirin alone per neurology recommendation.  Seen by physical therapy.  No needs identified.  Symptoms have improved  Hyperlipidemia Noted to be on pravastatin.  The dose was increased.    Patient is stable.  Okay for discharge today.  Cleared by neurology.  Discussed with Dr. Leonie Man.   PERTINENT LABS:  The results of significant diagnostics from this hospitalization (including imaging, microbiology, ancillary and laboratory) are listed below for reference.    Microbiology: Recent  Results (from the past 240 hour(s))  Resp Panel by RT-PCR (Flu A&B, Covid) Nasopharyngeal Swab     Status: None   Collection Time: 01/21/21  6:13 AM   Specimen: Nasopharyngeal Swab; Nasopharyngeal(NP) swabs in vial transport medium  Result Value Ref Range Status   SARS Coronavirus 2 by RT PCR NEGATIVE NEGATIVE Final    Comment: (NOTE) SARS-CoV-2  target nucleic acids are NOT DETECTED.  The SARS-CoV-2 RNA is generally detectable in upper respiratory specimens during the acute phase of infection. The lowest concentration of SARS-CoV-2 viral copies this assay can detect is 138 copies/mL. A negative result does not preclude SARS-Cov-2 infection and should not be used as the sole basis for treatment or other patient management decisions. A negative result may occur with  improper specimen collection/handling, submission of specimen other than nasopharyngeal swab, presence of viral mutation(s) within the areas targeted by this assay, and inadequate number of viral copies(<138 copies/mL). A negative result must be combined with clinical observations, patient history, and epidemiological information. The expected result is Negative.  Fact Sheet for Patients:  EntrepreneurPulse.com.au  Fact Sheet for Healthcare Providers:  IncredibleEmployment.be  This test is no t yet approved or cleared by the [place] FDA and  has been authorized for detection and/or diagnosis of SARS-CoV-2 by FDA under an Emergency Use Authorization (EUA). This EUA will remain  in effect (meaning this test can be used) for the duration of the COVID-19 declaration under Section 564(b)(1) of the Act, 21 U.S.C.section 360bbb-3(b)(1), unless the authorization is terminated  or revoked sooner.       Influenza A by PCR NEGATIVE NEGATIVE Final   Influenza B by PCR NEGATIVE NEGATIVE Final    Comment: (NOTE) The Xpert Xpress SARS-CoV-2/FLU/RSV plus assay is intended as an aid in the diagnosis of influenza from Nasopharyngeal swab specimens and should not be used as a sole basis for treatment. Nasal washings and aspirates are unacceptable for Xpert Xpress SARS-CoV-2/FLU/RSV testing.  Fact Sheet for Patients: EntrepreneurPulse.com.au  Fact Sheet for Healthcare  Providers: IncredibleEmployment.be  This test is not yet approved or cleared by the [place] FDA and has been authorized for detection and/or diagnosis of SARS-CoV-2 by FDA under an Emergency Use Authorization (EUA). This EUA will remain in effect (meaning this test can be used) for the duration of the COVID-19 declaration under Section 564(b)(1) of the Act, 21 U.S.C. section 360bbb-3(b)(1), unless the authorization is terminated or revoked.  Performed at [facility], [address], [place], [place] [address]      Labs:  COVID-19 Labs   Lab Results  Component Value Date   [place] 01/21/2021      Basic Metabolic Panel: Recent Labs  Lab 01/21/21 0611 01/21/21 1221  NA 142  --   K 4.5  --   CL 105  --   CO2 28  --   GLUCOSE 102*  --   BUN 15  --   CREATININE 1.15  --   CALCIUM 9.8  --   MG  --  1.9   Liver Function Tests: Recent Labs  Lab 01/21/21 0611  AST 26  ALT 25  ALKPHOS 49  BILITOT 0.6  PROT 7.4  ALBUMIN 4.7    CBC: Recent Labs  Lab 01/21/21 0611  WBC 6.3  NEUTROABS 3.0  HGB 16.4  HCT 48.2  MCV 92.3  PLT 236     CBG: Recent Labs  Lab 01/21/21 0602  GLUCAP 97     IMAGING STUDIES  MR ANGIO HEAD WO CONTRAST  Result Date: 01/21/2021 CLINICAL DATA:  Neuro deficit, acute, stroke suspected; Carotid stenosis screening, risk factors; acute stroke on MRI brain EXAM: MRA NECK WITHOUT AND WITH CONTRAST MRA HEAD WITHOUT CONTRAST TECHNIQUE: Multiplanar and multiecho pulse sequences of the neck were obtained without and with intravenous contrast. Angiographic images of the neck were obtained using MRA technique without and with intravenous contrast; Angiographic images of the Circle of Willis were obtained using MRA technique without intravenous contrast. CONTRAST:  8 mL Gadavist COMPARISON:  None. FINDINGS: MRA NECK FINDINGS Great vessel origins are patent. Common, internal, and external  carotid arteries are patent. Extracranial vertebral arteries are patent. Right vertebral artery is dominant. No hemodynamically significant stenosis or evidence of dissection. MRA HEAD FINDINGS Intracranial internal carotid arteries are patent. Anterior and middle cerebral arteries are patent. Anterior communicating artery is present. Intracranial vertebral arteries are patent. Right vertebral artery is dominant. Basilar artery is patent. Right posterior communicating artery is present. Possible left posterior communicating artery. Posterior cerebral arteries are patent. No significant stenosis or aneurysm. IMPRESSION: No large vessel occlusion, hemodynamically significant stenosis, or evidence of dissection. Electronically Signed   By: Macy Mis M.D.   On: 01/21/2021 17:08   MR ANGIO NECK W WO CONTRAST  Result Date: 01/21/2021 CLINICAL DATA:  Neuro deficit, acute, stroke suspected; Carotid stenosis screening, risk factors; acute stroke on MRI brain EXAM: MRA NECK WITHOUT AND WITH CONTRAST MRA HEAD WITHOUT CONTRAST TECHNIQUE: Multiplanar and multiecho pulse sequences of the neck were obtained without and with intravenous contrast. Angiographic images of the neck were obtained using MRA technique without and with intravenous contrast; Angiographic images of the Circle of Willis were obtained using MRA technique without intravenous contrast. CONTRAST:  8 mL Gadavist COMPARISON:  None. FINDINGS: MRA NECK FINDINGS Great vessel origins are patent. Common, internal, and external carotid arteries are patent. Extracranial vertebral arteries are patent. Right vertebral artery is dominant. No hemodynamically significant stenosis or evidence of dissection. MRA HEAD FINDINGS Intracranial internal carotid arteries are patent. Anterior and middle cerebral arteries are patent. Anterior communicating artery is present. Intracranial vertebral arteries are patent. Right vertebral artery is dominant. Basilar artery is  patent. Right posterior communicating artery is present. Possible left posterior communicating artery. Posterior cerebral arteries are patent. No significant stenosis or aneurysm. IMPRESSION: No large vessel occlusion, hemodynamically significant stenosis, or evidence of dissection. Electronically Signed   By: Macy Mis M.D.   On: 01/21/2021 17:08   MR BRAIN WO CONTRAST  Result Date: 01/21/2021 CLINICAL DATA:  Stroke follow-up.  Left arm numbness. EXAM: MRI HEAD WITHOUT CONTRAST TECHNIQUE: Multiplanar, multiecho pulse sequences of the brain and surrounding structures were obtained without intravenous contrast. COMPARISON:  Head CT January 21, 2021. FINDINGS: Brain: Subtle area of restricted diffusion in the right perirolandic subcortical white matter, suggesting acute infarct. There is faint corresponding T2 hyperintensity. No hemorrhage, extra-axial collection or mass lesion. Mild ventriculomegaly, particularly at the atria and occipital horns, may be developmental versus sequela periventricular leukomalacia. Small amount of scattered foci of T2 hyperintensity are seen within the white matter of the cerebral hemispheres, nonspecific, most likely related to early chronic microangiopathy. Vascular: Normal flow voids. Skull and upper cervical spine: Normal marrow signal. Sinuses/Orbits: Negative. Other: None. IMPRESSION: 1. Small area of restricted diffusion in the right frontoparietal (perirolandic) subcortical white matter consistent with acute infarct. 2. Mild chronic microvascular ischemic changes of the white matter. Electronically Signed   By: Sandre Kitty.D.  On: 01/21/2021 12:13   VAS [place] TRANSCRANIAL DOPPLER W BUBBLES  Result Date: 01/22/2021  Transcranial Doppler with Bubble Patient Name:  Mark Mclaughlin  Date of Exam:   01/22/2021 Medical Rec #: [id number]         Accession #:    [id number] Date of Birth: 1960/10/21         Patient Gender: M Patient Age:   53 years Exam  Location:  [facility] Procedure:      VAS [place] TRANSCRANIAL [place] Referring Phys: Bonnielee Haff --------------------------------------------------------------------------------  Indications: Stroke. Comparison Study: no prior Performing Technologist: Archie Patten RVS  Examination Guidelines: A complete evaluation includes B-mode imaging, spectral Doppler, color Doppler, and power Doppler as needed of all accessible portions of each vessel. Bilateral testing is considered an integral part of a complete examination. Limited examinations for reoccurring indications may be performed as noted.  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  Diagnosing physician: Antony Contras MD Electronically signed by Antony Contras MD on 01/22/2021 at 2:09:19 PM.    Final    ECHOCARDIOGRAM COMPLETE  Result Date: 01/21/2021    ECHOCARDIOGRAM REPORT   Patient Name:   Mark Mclaughlin Date of Exam: 01/21/2021 Medical Rec #:  [id number]        Height:       68.0 in Accession #:    [id number]       Weight:       185.0 lb Date of Birth:  1960/06/01        BSA:          1.977 m Patient Age:    89 years         BP:           135/81 mmHg Patient Gender: M                HR:           53 bpm. Exam Location:  Inpatient Procedure: 2D Echo, Cardiac Doppler and Color Doppler Indications:    Stroke  History:        Patient has no prior history of Echocardiogram examinations.  Sonographer:    Merrie Roof RDCS Referring Phys: [id number] [place]  1. Left ventricular ejection fraction, by estimation, is 55 to 60%. The left ventricle has normal function. The left ventricle has no regional wall motion abnormalities. There is mild left ventricular hypertrophy. Left ventricular diastolic parameters are consistent with Grade I diastolic dysfunction  (impaired relaxation).  2. Right ventricular systolic function is normal. The right ventricular size is normal. Tricuspid regurgitation signal is inadequate for assessing PA pressure.  3. Left atrial size was mild to moderately dilated.  4. The mitral valve is normal in structure. Trivial mitral valve regurgitation. No evidence of mitral stenosis.  5. The aortic valve is tricuspid. Aortic valve regurgitation is not visualized. No aortic stenosis is present.  6. The inferior vena cava is normal in size with greater than 50% respiratory variability, suggesting right atrial pressure of 3 mmHg. FINDINGS  Left Ventricle: Left ventricular ejection fraction, by estimation, is 55 to 60%. The left ventricle has normal function. The left ventricle has no regional wall motion abnormalities. The left ventricular internal cavity size was normal in size. There is  mild left ventricular hypertrophy. Left ventricular diastolic parameters  are consistent with Grade I diastolic dysfunction (impaired relaxation). Right Ventricle: The right ventricular size is normal. No increase in right ventricular wall thickness. Right ventricular systolic function is normal. Tricuspid regurgitation signal is inadequate for assessing PA pressure. Left Atrium: Left atrial size was mild to moderately dilated. Right Atrium: Right atrial size was normal in size. Pericardium: There is no evidence of pericardial effusion. Mitral Valve: The mitral valve is normal in structure. Trivial mitral valve regurgitation. No evidence of mitral valve stenosis. Tricuspid Valve: The tricuspid valve is normal in structure. Tricuspid valve regurgitation is not demonstrated. Aortic Valve: The aortic valve is tricuspid. Aortic valve regurgitation is not visualized. No aortic stenosis is present. Aortic valve mean gradient measures 4.0 mmHg. Aortic valve peak gradient measures 9.1 mmHg. Aortic valve area, by VTI measures 2.96 cm. Pulmonic Valve: The pulmonic valve was  normal in structure. Pulmonic valve regurgitation is not visualized. Aorta: The aortic root is normal in size and structure. Venous: The inferior vena cava is normal in size with greater than 50% respiratory variability, suggesting right atrial pressure of 3 mmHg. IAS/Shunts: No atrial level shunt detected by color flow Doppler.  LEFT VENTRICLE PLAX 2D LVIDd:         4.60 cm   Diastology LVIDs:         3.10 cm   LV e' medial:    9.44 cm/s LV PW:         1.10 cm   LV E/e' medial:  5.4 LV IVS:        1.10 cm   LV e' lateral:   9.36 cm/s LVOT diam:     2.00 cm   LV E/e' lateral: 5.4 LV SV:         84 LV SV Index:   43 LVOT Area:     3.14 cm  RIGHT VENTRICLE RV Basal diam:  3.40 cm LEFT ATRIUM             Index        RIGHT ATRIUM           Index LA diam:        4.40 cm 2.23 cm/m   RA Area:     15.10 cm LA Vol (A2C):   52.2 ml 26.40 ml/m  RA Volume:   34.50 ml  17.45 ml/m LA Vol (A4C):   82.9 ml 41.93 ml/m LA Biplane Vol: 66.5 ml 33.63 ml/m  AORTIC VALVE AV Area (Vmax):    3.18 cm AV Area (Vmean):   3.14 cm AV Area (VTI):     2.96 cm AV Vmax:           151.00 cm/s AV Vmean:          89.800 cm/s AV VTI:            0.284 m AV Peak Grad:      9.1 mmHg AV Mean Grad:      4.0 mmHg LVOT Vmax:         153.00 cm/s LVOT Vmean:        89.800 cm/s LVOT VTI:          0.268 m LVOT/AV VTI ratio: 0.94  AORTA Ao Root diam: 3.10 cm Ao Asc diam:  2.90 cm MITRAL VALVE MV Area (PHT): 3.65 cm    SHUNTS MV Decel Time: 208 msec    Systemic VTI:  0.27 m MV E velocity: 51.00 cm/s  Systemic Diam: 2.00 cm MV A velocity: 60.00 cm/s MV  E/A ratio:  0.85 Dalton McleanMD Electronically signed by Franki Monte Signature Date/Time: 01/21/2021/1:39:46 PM    Final    CT HEAD CODE STROKE WO CONTRAST  Result Date: 01/21/2021 CLINICAL DATA:  Code stroke. Left arm numbness beginning this morning EXAM: CT HEAD WITHOUT CONTRAST TECHNIQUE: Contiguous axial images were obtained from the base of the skull through the vertex without intravenous  contrast. COMPARISON:  None. FINDINGS: Brain: No evidence of acute infarction, hemorrhage, hydrocephalus, extra-axial collection or mass lesion/mass effect. Lateral ventriculomegaly, likely developmental. Vascular: No hyperdense vessel or unexpected calcification. Skull: Normal. Negative for fracture or focal lesion. Sinuses/Orbits: No acute finding. Other: These results were called by telephone at the time of interpretation on 01/21/2021 at 6:06 am to provider [facility] , who verbally acknowledged these results. ASPECTS [facility] Stroke Program Early CT Score) - Ganglionic level infarction (caudate, lentiform nuclei, internal capsule, insula, M1-M3 cortex): 7 - Supraganglionic infarction (M4-M6 cortex): 3 Total score (0-10 with 10 being normal): 10 IMPRESSION: 1. No acute finding. 2. ASPECTS is 10. Electronically Signed   By: Jorje Guild M.D.   On: 01/21/2021 06:07   VAS [place] LOWER EXTREMITY VENOUS (DVT)  Result Date: 01/23/2021  Lower Venous DVT Study Patient Name:  Mark Mclaughlin  Date of Exam:   01/22/2021 Medical Rec #: [id number]         Accession #:    [id number] Date of Birth: 17-Sep-1960         Patient Gender: M Patient Age:   63 years Exam Location:  [facility] Procedure:      VAS [place] LOWER EXTREMITY VENOUS (DVT) Referring Phys: PRAMOD SETHI --------------------------------------------------------------------------------  Indications: Stroke.  Comparison Study: no prior Performing Technologist: Archie Patten RVS  Examination Guidelines: A complete evaluation includes B-mode imaging, spectral Doppler, color Doppler, and power Doppler as needed of all accessible portions of each vessel. Bilateral testing is considered an integral part of a complete examination. Limited examinations for reoccurring indications may be performed as noted. The reflux portion of the exam is performed with the patient in reverse Trendelenburg.   +---------+---------------+---------+-----------+----------+--------------+ RIGHT    CompressibilityPhasicitySpontaneityPropertiesThrombus Aging +---------+---------------+---------+-----------+----------+--------------+ CFV      Full           Yes      Yes                                 +---------+---------------+---------+-----------+----------+--------------+ SFJ      Full                                                        +---------+---------------+---------+-----------+----------+--------------+ FV Prox  Full                                                        +---------+---------------+---------+-----------+----------+--------------+ FV Mid   Full                                                        +---------+---------------+---------+-----------+----------+--------------+  FV DistalFull                                                        +---------+---------------+---------+-----------+----------+--------------+ PFV      Full                                                        +---------+---------------+---------+-----------+----------+--------------+ POP      Full           Yes      Yes                                 +---------+---------------+---------+-----------+----------+--------------+ PTV      Full                                                        +---------+---------------+---------+-----------+----------+--------------+ PERO     Full                                                        +---------+---------------+---------+-----------+----------+--------------+   +---------+---------------+---------+-----------+----------+--------------+ LEFT     CompressibilityPhasicitySpontaneityPropertiesThrombus Aging +---------+---------------+---------+-----------+----------+--------------+ CFV      Full           Yes      Yes                                  +---------+---------------+---------+-----------+----------+--------------+ SFJ      Full                                                        +---------+---------------+---------+-----------+----------+--------------+ FV Prox  Full                                                        +---------+---------------+---------+-----------+----------+--------------+ FV Mid   Full                                                        +---------+---------------+---------+-----------+----------+--------------+ FV DistalFull                                                        +---------+---------------+---------+-----------+----------+--------------+  PFV      Full                                                        +---------+---------------+---------+-----------+----------+--------------+ POP      Full           Yes      Yes                                 +---------+---------------+---------+-----------+----------+--------------+ PTV      Full                                                        +---------+---------------+---------+-----------+----------+--------------+ PERO     Full                                                        +---------+---------------+---------+-----------+----------+--------------+     Summary: BILATERAL: - No evidence of deep vein thrombosis seen in the lower extremities, bilaterally. -No evidence of popliteal cyst, bilaterally.   *See table(s) above for measurements and observations. Electronically signed by Harold Barban MD on 01/23/2021 at 12:54:29 AM.    Final     DISCHARGE EXAMINATION: Vitals:   01/22/21 0030 01/22/21 0434 01/22/21 0758 01/22/21 1113  BP: 121/78 122/78 130/87 131/89  Pulse: (!) 53 (!) 52 (!) 53 (!) 54  Resp: 18  14 14   Temp: (!) 97.5 F (36.4 C) 97.9 F (36.6 C) 97.7 F (36.5 C) 98.1 F (36.7 C)  TempSrc: Oral Oral Oral Oral  SpO2: 97% 96%  95%  Weight:      Height:       General  appearance: Awake alert.  In no distress Resp: Clear to auscultation bilaterally.  Normal effort Cardio: S1-S2 is normal regular.  No S3-S4.  No rubs murmurs or bruit GI: Abdomen is soft.  Nontender nondistended.  Bowel sounds are present normal.  No masses organomegaly Extremities: No edema.  Full range of motion of lower extremities. Neurologic: Improved strength of the left upper extremity noted.   DISPOSITION: Home  Discharge Instructions     Ambulatory referral to Neurology   Complete by: As directed    An appointment is requested in approximately: 8 weeks for acute CVA   Ambulatory referral to Occupational Therapy   Complete by: As directed    Ambulatory referral to Physical Therapy   Complete by: As directed    Call MD for:  difficulty breathing, headache or visual disturbances   Complete by: As directed    Call MD for:  extreme fatigue   Complete by: As directed    Call MD for:  persistant dizziness or light-headedness   Complete by: As directed    Call MD for:  persistant nausea and vomiting   Complete by: As directed    Call MD for:  severe uncontrolled pain   Complete by: As directed    Call MD for:  temperature >  100.4   Complete by: As directed    Diet - low sodium heart healthy   Complete by: As directed    Discharge instructions   Complete by: As directed    Please take your medications as prescribed.  Please be sure to follow-up with primary care provider.  Message will be sent to the cardiologist to arrange an outpatient TEE.  You can also discuss this when you see the cardiologist at your scheduled appointment.  You were cared for by a hospitalist during your hospital stay. If you have any questions about your discharge medications or the care you received while you were in the hospital after you are discharged, you can call the unit and asked to speak with the hospitalist on call if the hospitalist that took care of you is not available. Once you are discharged,  your primary care physician will handle any further medical issues. Please note that NO REFILLS for any discharge medications will be authorized once you are discharged, as it is imperative that you return to your primary care physician (or establish a relationship with a primary care physician if you do not have one) for your aftercare needs so that they can reassess your need for medications and monitor your lab values. If you do not have a primary care physician, you can call [phone] for a physician referral.   Increase activity slowly   Complete by: As directed           Allergies as of 01/22/2021       Reactions   Atorvastatin Other (See Comments)        Medication List     TAKE these medications    acetaminophen 500 MG tablet Commonly known as: TYLENOL Take 1,000 mg by mouth every 6 (six) hours as needed for mild pain.   aspirin 81 MG EC tablet Take 1 tablet (81 mg total) by mouth daily. Swallow whole.   clopidogrel 75 MG tablet Commonly known as: PLAVIX Take 1 tablet (75 mg total) by mouth daily for 21 days. For 3 weeks only   multivitamin tablet Take 1 tablet by mouth daily.   OMEGA 3 PO Take 1 capsule by mouth daily.   pravastatin 80 MG tablet Commonly known as: PRAVACHOL Take 1 tablet (80 mg total) by mouth daily at 6 PM. What changed:  medication strength how much to take when to take this          Follow-up Information     Caren Macadam, MD. Schedule an appointment as soon as possible for a visit in 1 week(s).   Specialty: Family Medicine Contact information: [place] [address] [phone]                 TOTAL DISCHARGE TIME: 28 minutes  [place]  [facility] Pager on [email or web address]  01/23/2021, 11:03 AM

## 2021-01-22 NOTE — Evaluation (Signed)
Occupational Therapy Evaluation Patient Details Name: Mark Mclaughlin MRN: [id number] DOB: [birth year] Today's Date: 01/22/2021   History of Present Illness Pt is a 59yo male who came to ED due to L UE feeling numb and heavy. PMH: hyperlipidemia.   Clinical Impression   Pt seen for concerns listed above. PTA pt reported that he was independent with all ADL's and IADL's, including jogging a couple times a week. At this time, pt appears to be back to his baseline with no residual deficits. He reports of a numb area on the hypothenar eminence, however his coordination is in tact. Pt has no further OT needs and acute OT will sign off.       Recommendations for follow up therapy are one component of a multi-disciplinary discharge planning process, led by the attending physician.  Recommendations may be updated based on patient status, additional functional criteria and insurance authorization.   Follow Up Recommendations  No OT follow up    Assistance Recommended at Discharge None  Functional Status Assessment  Patient has had a recent decline in their functional status and demonstrates the ability to make significant improvements in function in a reasonable and predictable amount of time.  Equipment Recommendations  None recommended by OT    Recommendations for Other Services       Precautions / Restrictions Precautions Precautions: None Restrictions Weight Bearing Restrictions: No      Mobility Bed Mobility Overal bed mobility: Independent                  Transfers Overall transfer level: Independent Equipment used: None Transfers: Sit to/from Stand Sit to Stand: Independent           General transfer comment: pt quick to stand up      Balance Overall balance assessment: Independent                                         ADL either performed or assessed with clinical judgement   ADL Overall ADL's : At baseline;Independent                                        General ADL Comments: no difficulties with ADL's or functional mobility.     Vision Baseline Vision/History: 0 No visual deficits Ability to See in Adequate Light: 0 Adequate Patient Visual Report: No change from baseline Vision Assessment?: No apparent visual deficits     Perception     Praxis      Pertinent Vitals/Pain Pain Assessment: No/denies pain     Hand Dominance Right   Extremity/Trunk Assessment Upper Extremity Assessment Upper Extremity Assessment: Overall WFL for tasks assessed LUE Deficits / Details: Hypothenar eminence numb LUE Sensation: decreased light touch   Lower Extremity Assessment Lower Extremity Assessment: Defer to PT evaluation   Cervical / Trunk Assessment Cervical / Trunk Assessment: Normal   Communication Communication Communication: No difficulties   Cognition Arousal/Alertness: Awake/alert Behavior During Therapy: WFL for tasks assessed/performed Overall Cognitive Status: Within Functional Limits for tasks assessed                                       General Comments  Educated on Stroke signs and symptoms  Exercises     Shoulder Instructions      Home Living Family/patient expects to be discharged to:: Private residence Living Arrangements: Spouse/significant other Available Help at Discharge: Family;Available 24 hours/day Type of Home: House Home Access: Stairs to enter [employer] of Steps: 2 Entrance Stairs-Rails: Right;None Home Layout: Two level;Able to live on main level with bedroom/bathroom Alternate Level Stairs-Number of Steps: flight Alternate Level Stairs-Rails: Can reach both Bathroom Shower/Tub: Occupational psychologist: Standard     Home Equipment: None          Prior Functioning/Environment Prior Level of Function : Independent/Modified Independent             Mobility Comments: indep ADLs Comments: indep         OT Problem List: Impaired balance (sitting and/or standing);Decreased coordination      OT Treatment/Interventions:      OT Goals(Current goals can be found in the care plan section) Acute Rehab OT Goals Patient Stated Goal: To go home OT Goal Formulation: With patient Time For Goal Achievement: 01/22/21 Potential to Achieve Goals: Good  OT Frequency:     Barriers to D/C:            Co-evaluation              AM-PAC OT "6 Clicks" Daily Activity     Outcome Measure Help from another person eating meals?: None Help from another person taking care of personal grooming?: None Help from another person toileting, which includes using toliet, bedpan, or urinal?: None Help from another person bathing (including washing, rinsing, drying)?: None Help from another person to put on and taking off regular upper body clothing?: None Help from another person to put on and taking off regular lower body clothing?: None 6 Click Score: 24   End of Session Nurse Communication: Mobility status  Activity Tolerance: Patient tolerated treatment well Patient left: in chair;with call bell/phone within reach  OT Visit Diagnosis: Muscle weakness (generalized) (M62.81)                Time: 1610-9604 OT Time Calculation (min): 17 min Charges:  OT General Charges $OT Visit: 1 Visit OT Evaluation $OT Eval Low Complexity: 1 Low  [name], OTR/L Acute Rehabilitation  [name] [name] 01/22/2021, 4:41 PM

## 2021-01-27 ENCOUNTER — Other Ambulatory Visit: Payer: Self-pay | Admitting: *Deleted

## 2021-01-27 NOTE — Patient Outreach (Signed)
[facility]) Care Management  01/27/2021  Tej Murdaugh 11-Jun-1960 [id number]   RED ON EMMI ALERT - Stroke Day # 1 Date: 11/25 Red Alert Reason: Problems setting up rehab? Yes Scheduled follow up appointment?  I don't know   Outreach attempt #1, unsuccessful, HIPAA compliant voice message left.    Plan: RN CM will send outreach letter and follow up within the next 3-5 business days.  Valente David, [place], MSN [place] [phone]

## 2021-01-28 ENCOUNTER — Telehealth: Payer: Self-pay

## 2021-01-28 ENCOUNTER — Other Ambulatory Visit: Payer: Self-pay

## 2021-01-28 ENCOUNTER — Ambulatory Visit (INDEPENDENT_AMBULATORY_CARE_PROVIDER_SITE_OTHER): Payer: BC Managed Care – PPO | Admitting: Cardiovascular Disease

## 2021-01-28 ENCOUNTER — Encounter: Payer: Self-pay | Admitting: Cardiovascular Disease

## 2021-01-28 ENCOUNTER — Encounter: Payer: Self-pay | Admitting: Radiology

## 2021-01-28 DIAGNOSIS — E782 Mixed hyperlipidemia: Secondary | ICD-10-CM

## 2021-01-28 DIAGNOSIS — R001 Bradycardia, unspecified: Secondary | ICD-10-CM | POA: Diagnosis not present

## 2021-01-28 DIAGNOSIS — I639 Cerebral infarction, unspecified: Secondary | ICD-10-CM | POA: Diagnosis not present

## 2021-01-28 NOTE — Assessment & Plan Note (Signed)
History of hyperlipidemia originally on low-dose pravastatin which has been recently increased to 80 mg a day.  His most recent lipid profile performed 01/21/2021 revealed total cholesterol of 205, LDL of 127 and HDL of 66.  We will recheck a lipid liver profile in 3 months.

## 2021-01-28 NOTE — Telephone Encounter (Signed)
Just an FYI, I spoke with Mr. Mark Mclaughlin today to get him scheduled for his 2 month hospital f/u. He wanted me to let you know that he did some research and thinks he knows what caused his blood clot and hospitalization. He states that he found online that stress can cause blood clots and he did have a significant event happen a few days prior to his hospitalization.

## 2021-01-28 NOTE — Progress Notes (Signed)
01/28/2021 Mark Mclaughlin   [birth year]  [id number]  Primary Physician Caren Macadam, MD Primary Cardiologist: Lorretta Harp MD Lupe Carney, [place]  HPI:  Mark Mclaughlin is a 60 y.o. thin and fit appearing married Caucasian male father of one 76 year old child (72 year old son died 5 years ago of a drug overdose), grandfather to one grandchild who was referred by Dr. Leonie Man, stroke neurologist and his PCP for cardiovascular evaluation because of recent stroke.  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  His only cardiac risk factor is hyperlipidemia on statin therapy.  There is no family history of heart disease.  He has never had a heart heart attack but did recently have a stroke 01/21/2021.  He denies chest pain or shortness of breath.  He is very active and is run a marathons and recently started lifting weights 2 months ago.  On 01/21/2021 he awoke with left arm numbness.  He thought he may have slept on it wrong although the symptoms persisted.  He presented to the emergency room where he was evaluated.  An MRI of his brain suggested a stroke in the frontoparietal area.  An MRA showed no evidence of carotid disease.  A transcranial Doppler suggested a right to left shunt, 2D echo was essentially normal although bubble study confirmed a shunt.  He was discharged home on aspirin and Plavix.  The symptoms of numbness of in his left upper extremity have almost completely resolved with a small area of continued numbness in his left hand.   Current Meds  Medication Sig   aspirin EC 81 MG EC tablet Take 1 tablet (81 mg total) by mouth daily. Swallow whole.   clopidogrel (PLAVIX) 75 MG tablet Take 1 tablet (75 mg total) by mouth daily for 21 days. For 3 weeks only   Multiple Vitamin (MULTIVITAMIN) tablet Take 1 tablet by mouth daily.   Omega-3 Fatty Acids (OMEGA 3 PO) Take 1 capsule by mouth daily.   pravastatin (PRAVACHOL) 80 MG tablet Take 1 tablet (80 mg total) by mouth daily at 6 PM.      Allergies  Allergen Reactions   Atorvastatin Other (See Comments)    Social History   Socioeconomic History   Marital status: Married    Spouse name: Not on file   Number of children: Not on file   Years of education: Not on file   Highest education level: Not on file  Occupational History   Not on file  Tobacco Use   Smoking status: Never   Smokeless tobacco: Not on file  Substance and Sexual Activity   Alcohol use: No   Drug use: No   Sexual activity: Not on file  Other Topics Concern   Not on file  Social History Narrative   Not on file   Social Determinants of Health   Financial Resource Strain: Not on file  Food Insecurity: Not on file  Transportation Needs: Not on file  Physical Activity: Not on file  Stress: Not on file  Social Connections: Not on file  Intimate Partner Violence: Not on file     Review of Systems: General: negative for chills, fever, night sweats or weight changes.  Cardiovascular: negative for chest pain, dyspnea on exertion, edema, orthopnea, palpitations, paroxysmal nocturnal dyspnea or shortness of breath Dermatological: negative for rash Respiratory: negative for cough or wheezing Urologic: negative for hematuria Abdominal: negative for nausea, vomiting, diarrhea, bright red blood per rectum, melena, or hematemesis Neurologic: negative for visual  changes, syncope, or dizziness All other systems reviewed and are otherwise negative except as noted above.    Blood pressure 121/70, pulse (!) 54, height 5\' 10"  (1.778 m), weight 187 lb 9.6 oz (85.1 kg), SpO2 97 %.  General appearance: alert and no distress Neck: no adenopathy, no carotid bruit, no JVD, supple, symmetrical, trachea midline, and thyroid not enlarged, symmetric, no tenderness/mass/nodules Lungs: clear to auscultation bilaterally Heart: regular rate and rhythm, S1, S2 normal, no murmur, click, rub or gallop Extremities: extremities normal, atraumatic, no cyanosis or  edema Pulses: 2+ and symmetric Skin: Skin color, texture, turgor normal. No rashes or lesions Neurologic: Grossly normal  EKG sinus bradycardia with first-degree AV block and lateral T wave inversion with septal Q waves.  I personally reviewed this EKG.  ASSESSMENT AND PLAN:   CVA (cerebral vascular accident) [facility]) Mr. Tagliaferro was recently hospitalized 01/21/2021 with acute onset of left upper extremity numbness.  He was seen in the hospital where MRI of the brain showed a small area of restricted diffusion in the right frontoparietal region, subcortical white matter consistent with acute infarct.  He was seen by Dr. Leonie Man, stroke neurologist who placed him on aspirin and Plavix.  An MRI did not show any carotid disease.  A transcranial Doppler suggested a right to left shunt and a 2D echo performed with bubble study apparently showed a shunt as well.  He had no DVT on lower extremity venous Doppler study.  It was recommended that he have a TEE which I will arrange.  Also going to get a 30-day Zio patch to look for arrhythmia.  Hyperlipidemia History of hyperlipidemia originally on low-dose pravastatin which has been recently increased to 80 mg a day.  His most recent lipid profile performed 01/21/2021 revealed total cholesterol of 205, LDL of 127 and HDL of 66.  We will recheck a lipid liver profile in 3 months.  Bradycardia Patient has sinus bradycardia with heart rates in the 50s.  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      Lorretta Harp MD [place], [facility] 01/28/2021 3:43 PM

## 2021-01-28 NOTE — Assessment & Plan Note (Signed)
Patient has sinus bradycardia with heart rates in the 50s.  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

## 2021-01-28 NOTE — Progress Notes (Signed)
Enrolled patient for a 30 day [employer] Event monitor to be mailed to patients home address.

## 2021-01-28 NOTE — Patient Instructions (Addendum)
Medication Instructions:  Your physician recommends that you continue on your current medications as directed. Please refer to the Current Medication list given to you today.  *If you need a refill on your cardiac medications before your next appointment, please call your pharmacy*   Lab Work: Your physician recommends that you return for lab work in: 3 months for FASTING lipid/liver profile.   Your physician recommends that you return for lab work in: within 7 days of TEE- BMET and CBC   If you have labs (blood work) drawn today and your tests are completely normal, you will receive your results only by: [place] (if you have MyChart) OR A paper copy in the mail If you have any lab test that is abnormal or we need to change your treatment, we will call you to review the results.   Testing/Procedures: [employer] Cardiac Event Monitor Instructions Your physician has requested you wear your cardiac event monitor for 30 days. [employer] may call or text to confirm a shipping address. The monitor will be sent to a land address via UPS. [employer] will not ship a monitor to a PO BOX. It typically takes 3-5 days to receive your monitor after it has been enrolled. [employer] will assist with USPS tracking if your package is delayed. The telephone number for [employer] is [phone]. Once you have received your monitor, please review the enclosed instructions. Instruction tutorials can also be viewed under help and settings on the enclosed cell phone. Your monitor has already been registered assigning a specific monitor serial # to you.  Applying the monitor Remove cell phone from case and turn it on. The cell phone works as Dealer and needs to be within [employer] of you at all times. The cell phone will need to be charged on a daily basis. We recommend you plug the cell phone into the enclosed charger at your bedside table every night.  Monitor batteries: You will  receive two monitor batteries labelled #1 and #2. These are your recorders. Plug battery #2 onto the second connection on the enclosed charger. Keep one battery on the charger at all times. This will keep the monitor battery deactivated. It will also keep it fully charged for when you need to switch your monitor batteries. A small light will be blinking on the battery emblem when it is charging. The light on the battery emblem will remain on when the battery is fully charged.  Open package of a Monitor strip. Insert battery #1 into black hood on strip and gently squeeze monitor battery onto connection as indicated in instruction booklet. Set aside while preparing skin.  Choose location for your strip, vertical or horizontal, as indicated in the instruction booklet. Shave to remove all hair from location. There cannot be any lotions, oils, powders, or colognes on skin where monitor is to be applied. Wipe skin clean with enclosed Saline wipe. Dry skin completely.  Peel paper labeled #1 off the back of the Monitor strip exposing the adhesive. Place the monitor on the chest in the vertical or horizontal position shown in the instruction booklet. One arrow on the monitor strip must be pointing upward. Carefully remove paper labeled #2, attaching remainder of strip to your skin. Try not to create any folds or wrinkles in the strip as you apply it.  Firmly press and release the circle in the center of the monitor battery. You will hear a small beep. This is turning the monitor battery on. The heart emblem on  the monitor battery will light up every 5 seconds if the monitor battery in turned on and connected to the patient securely. Do not push and hold the circle down as this turns the monitor battery off. The cell phone will locate the monitor battery. A screen will appear on the cell phone checking the connection of your monitor strip. This may read poor connection initially but change to good  connection within the next minute. Once your monitor accepts the connection you will hear a series of 3 beeps followed by a climbing crescendo of beeps. A screen will appear on the cell phone showing the two monitor strip placement options. Touch the picture that demonstrates where you applied the monitor strip.  Your monitor strip and battery are waterproof. You are able to shower, bathe, or swim with the monitor on. They just ask you do not submerge deeper than 3 feet underwater. We recommend removing the monitor if you are swimming in a lake, river, or ocean.  Your monitor battery will need to be switched to a fully charged monitor battery approximately once a week. The cell phone will alert you of an action which needs to be made.  On the cell phone, tap for details to reveal connection status, monitor battery status, and cell phone battery status. The green dots indicates your monitor is in good status. A red dot indicates there is something that needs your attention.  To record a symptom, click the circle on the monitor battery. In 30-60 seconds a list of symptoms will appear on the cell phone. Select your symptom and tap save. Your monitor will record a sustained or significant arrhythmia regardless of you clicking the button. Some patients do not feel the heart rhythm irregularities. [employer] will notify us of any serious or critical events.  Refer to instruction booklet for instructions on switching batteries, changing strips, the Do not disturb or Pause features, or any additional questions.  Call [employer] at [phone], to confirm your monitor is transmitting and record your baseline. They will answer any questions you may have regarding the monitor instructions at that time.  Returning the monitor to [place] all equipment back into blue box. Peel off strip of paper to expose adhesive and close box securely. There is a prepaid UPS shipping label on this box.  Drop in a UPS drop box, or at a UPS facility like [employer]. You may also contact [employer] to arrange UPS to pick up monitor package at your home.    Follow-Up: At [facility], you and your health needs are our priority.  As part of our continuing mission to provide you with exceptional heart care, we have created designated Provider Care Teams.  These Care Teams include your primary Cardiologist (physician) and Advanced Practice Providers (APPs -  Physician Assistants and Nurse Practitioners) who all work together to provide you with the care you need, when you need it.  We recommend signing up for the patient portal called "MyChart".  Sign up information is provided on this After Visit Summary.  MyChart is used to connect with patients for Virtual Visits (Telemedicine).  Patients are able to view lab/test results, encounter notes, upcoming appointments, etc.  Non-urgent messages can be sent to your provider as well.   To learn more about what you can do with MyChart, go to [email or web address].    Your next appointment:   3 month(s)  The format for your next appointment:   In Person  Provider:  :1}  Roderic Palau  Gwenlyn Found, MD   Other Instructions  You are scheduled for a TEE on Friday, December 16th with Dr. Johney Frame.  Please arrive at the [facility] (Main Entrance A) at [facility]: [address] [place], [place] [address] at 1pm. (1 hour prior to procedure unless lab work is needed; if lab work is needed arrive 1.5 hours ahead)  DIET: Nothing to eat or drink after midnight except a sip of water with medications (see medication instructions below)  FYI: For your safety, and to allow [place] to monitor your vital signs accurately during the surgery/procedure we request that   if you have artificial nails, gel coating, SNS etc. Please have those removed prior to your surgery/procedure. Not having the nail coverings /polish removed may result in cancellation or delay of your  surgery/procedure.   You must have a responsible person to drive you home and stay in the waiting area during your procedure. Failure to do so could result in cancellation.  Bring your insurance cards.  *Special Note: Every effort is made to have your procedure done on time. Occasionally there are emergencies that occur at the hospital that may cause delays. Please be patient if a delay does occur.

## 2021-01-28 NOTE — Assessment & Plan Note (Signed)
Mark Mclaughlin was recently hospitalized 01/21/2021 with acute onset of left upper extremity numbness.  He was seen in the hospital where MRI of the brain showed a small area of restricted diffusion in the right frontoparietal region, subcortical white matter consistent with acute infarct.  He was seen by Dr. Leonie Man, stroke neurologist who placed him on aspirin and Plavix.  An MRI did not show any carotid disease.  A transcranial Doppler suggested a right to left shunt and a 2D echo performed with bubble study apparently showed a shunt as well.  He had no DVT on lower extremity venous Doppler study.  It was recommended that he have a TEE which I will arrange.  Also going to get a 30-day Zio patch to look for arrhythmia.

## 2021-01-28 NOTE — H&P (View-Only) (Signed)
01/28/2021 Mark Mclaughlin   09-07-1960  [id number]  Primary Physician Caren Macadam, MD Primary Cardiologist: Lorretta Harp MD Mark Mclaughlin, [place]  HPI:  Mark Mclaughlin is a 60 y.o. thin and fit appearing married Caucasian male father of one 56 year old child (65 year old son died 5 years ago of a drug overdose), grandfather to one grandchild who was referred by Dr. Leonie Mclaughlin, stroke neurologist and his PCP for cardiovascular evaluation because of recent stroke.  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  His only cardiac risk factor is hyperlipidemia on statin therapy.  There is no family history of heart disease.  He has never had a heart heart attack but did recently have a stroke 01/21/2021.  He denies chest pain or shortness of breath.  He is very active and is run a marathons and recently started lifting weights 2 months ago.  On 01/21/2021 he awoke with left arm numbness.  He thought he may have slept on it wrong although the symptoms persisted.  He presented to the emergency room where he was evaluated.  An MRI of his brain suggested a stroke in the frontoparietal area.  An MRA showed no evidence of carotid disease.  A transcranial Doppler suggested a right to left shunt, 2D echo was essentially normal although bubble study confirmed a shunt.  He was discharged home on aspirin and Plavix.  The symptoms of numbness of in his left upper extremity have almost completely resolved with a small area of continued numbness in his left hand.   Current Meds  Medication Sig   aspirin EC 81 MG EC tablet Take 1 tablet (81 mg total) by mouth daily. Swallow whole.   clopidogrel (PLAVIX) 75 MG tablet Take 1 tablet (75 mg total) by mouth daily for 21 days. For 3 weeks only   Multiple Vitamin (MULTIVITAMIN) tablet Take 1 tablet by mouth daily.   Omega-3 Fatty Acids (OMEGA 3 PO) Take 1 capsule by mouth daily.   pravastatin (PRAVACHOL) 80 MG tablet Take 1 tablet (80 mg total) by mouth daily at 6 PM.      Allergies  Allergen Reactions   Atorvastatin Other (See Comments)    Social History   Socioeconomic History   Marital status: Married    Spouse name: Not on file   Number of children: Not on file   Years of education: Not on file   Highest education level: Not on file  Occupational History   Not on file  Tobacco Use   Smoking status: Never   Smokeless tobacco: Not on file  Substance and Sexual Activity   Alcohol use: No   Drug use: No   Sexual activity: Not on file  Other Topics Concern   Not on file  Social History Narrative   Not on file   Social Determinants of Health   Financial Resource Strain: Not on file  Food Insecurity: Not on file  Transportation Needs: Not on file  Physical Activity: Not on file  Stress: Not on file  Social Connections: Not on file  Intimate Partner Violence: Not on file     Review of Systems: General: negative for chills, fever, night sweats or weight changes.  Cardiovascular: negative for chest pain, dyspnea on exertion, edema, orthopnea, palpitations, paroxysmal nocturnal dyspnea or shortness of breath Dermatological: negative for rash Respiratory: negative for cough or wheezing Urologic: negative for hematuria Abdominal: negative for nausea, vomiting, diarrhea, bright red blood per rectum, melena, or hematemesis Neurologic: negative for visual  changes, syncope, or dizziness All other systems reviewed and are otherwise negative except as noted above.    Blood pressure 121/70, pulse (!) 54, height 5\' 10"  (1.778 m), weight 187 lb 9.6 oz (85.1 kg), SpO2 97 %.  General appearance: alert and no distress Neck: no adenopathy, no carotid bruit, no JVD, supple, symmetrical, trachea midline, and thyroid not enlarged, symmetric, no tenderness/mass/nodules Lungs: clear to auscultation bilaterally Heart: regular rate and rhythm, S1, S2 normal, no murmur, click, rub or gallop Extremities: extremities normal, atraumatic, no cyanosis or  edema Pulses: 2+ and symmetric Skin: Skin color, texture, turgor normal. No rashes or lesions Neurologic: Grossly normal  EKG sinus bradycardia with first-degree AV block and lateral T wave inversion with septal Q waves.  I personally reviewed this EKG.  ASSESSMENT AND PLAN:   CVA (cerebral vascular accident) Mark Mclaughlin) Mark Mclaughlin was recently hospitalized 01/21/2021 with acute onset of left upper extremity numbness.  He was seen in the hospital where MRI of the brain showed a small area of restricted diffusion in the right frontoparietal region, subcortical white matter consistent with acute infarct.  He was seen by Dr. Leonie Mclaughlin, stroke neurologist who placed him on aspirin and Plavix.  An MRI did not show any carotid disease.  A transcranial Doppler suggested a right to left shunt and a 2D echo performed with bubble study apparently showed a shunt as well.  He had no DVT on lower extremity venous Doppler study.  It was recommended that he have a TEE which I will arrange.  Also going to get a 30-day Zio patch to look for arrhythmia.  Hyperlipidemia History of hyperlipidemia originally on low-dose pravastatin which has been recently increased to 80 mg a day.  His most recent lipid profile performed 01/21/2021 revealed total cholesterol of 205, LDL of 127 and HDL of 66.  We will recheck a lipid liver profile in 3 months.  Bradycardia Patient has sinus bradycardia with heart rates in the 50s.  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      Lorretta Harp MD [place], [facility] Mclaughlin 01/28/2021 3:43 PM

## 2021-01-29 ENCOUNTER — Other Ambulatory Visit: Payer: Self-pay

## 2021-01-29 NOTE — Addendum Note (Signed)
Addended by: Beatrix Fetters on: 01/29/2021 07:49 AM   Modules accepted: Orders

## 2021-01-30 ENCOUNTER — Other Ambulatory Visit: Payer: Self-pay | Admitting: *Deleted

## 2021-01-30 NOTE — Patient Outreach (Signed)
[facility]) Care Management Telephonic RN Care Manager Note    02/03/2021 Name:  Mark Mclaughlin MRN:  [id number] DOB:  02/21/1961  Summary: EMMI stroke red flag outreach to follow up for red alert EMMI  flag on Friday 01/24/21 0647 am for  scheduled a follow up appointment?  I don't know and Problem setting up rehab? yes patient denies issues with scheduling follow up appointments nor rehab follow services  He confirms he has follow up appointments scheduled Possible error with background noise in EMMI outreach   Recommendations/Changes made from today's visit: Encouraged outreach prn to this RN CM or assigned RN CM  Advised patient that there will be further automated EMMI- post discharge calls to assess how the patient is doing following the recent hospitalization Advised the patient that another call may be received from a nurse if any of their responses were abnormal. Patient voiced understanding and was appreciative of f/u call.   Subjective: Mark Mclaughlin is an 60 y.o. year old male who is a primary patient of Caren Macadam, MD. The care management team was consulted for assistance with care management and/or care coordination needs.    Telephonic RN Care Manager completed Telephone Visit today.  (Coverage for Monica L)  Objective:  Medications Reviewed Today     Reviewed by Lorretta Harp, MD (Physician) on 01/28/21 at 1544  Med List Status: <None>   Medication Order Taking? Sig Documenting Provider Last Dose Status Informant  aspirin EC 81 MG EC tablet [id number] Yes Take 1 tablet (81 mg total) by mouth daily. Swallow whole. Bonnielee Haff, MD Taking Active   clopidogrel (PLAVIX) 75 MG tablet [id number] Yes Take 1 tablet (75 mg total) by mouth daily for 21 days. For 3 weeks only Bonnielee Haff, MD Taking Active   Multiple Vitamin (MULTIVITAMIN) tablet [id number] Yes Take 1 tablet by mouth daily. [provider] Taking Active Self  Omega-3  Fatty Acids (OMEGA 3 PO) [id number] Yes Take 1 capsule by mouth daily. [provider] Taking Active Self  pravastatin (PRAVACHOL) 80 MG tablet [id number] Yes Take 1 tablet (80 mg total) by mouth daily at 6 PM. Bonnielee Haff, MD Taking Active              SDOH:  (Social Determinants of Health) assessments and interventions performed:  SDOH Interventions    Flowsheet Row Most Recent Value  SDOH Interventions   Food Insecurity Interventions Intervention Not Indicated  Financial Strain Interventions Intervention Not Indicated  Housing Interventions Intervention Not Indicated  Intimate Partner Violence Interventions Intervention Not Indicated  Stress Interventions Intervention Not Indicated  Transportation Interventions Intervention Not Indicated       Care Plan  Review of patient past medical history, allergies, medications, health status, including review of consultants reports, laboratory and other test data, was performed as part of comprehensive evaluation for care management services.   There are no care plans that you recently modified to display for this patient.    Plan:  RN CM will updated assigned RN CM of completed EMMI assessment without identified needs at this time  Sakeena Teall L. Lavina Hamman, RN, BSN, [place] Coordinator Office number [phone] [facility] number [phone] Fax number [phone]

## 2021-02-03 ENCOUNTER — Encounter: Payer: Self-pay | Admitting: *Deleted

## 2021-02-04 ENCOUNTER — Encounter (HOSPITAL_COMMUNITY): Payer: Self-pay | Admitting: Cardiology

## 2021-02-04 NOTE — Progress Notes (Signed)
Attempted to obtain medical history via telephone, unable to reach at this time. I left a voicemail to return pre surgical testing department's phone call.  

## 2021-02-05 ENCOUNTER — Ambulatory Visit (INDEPENDENT_AMBULATORY_CARE_PROVIDER_SITE_OTHER): Payer: BC Managed Care – PPO

## 2021-02-05 DIAGNOSIS — I639 Cerebral infarction, unspecified: Secondary | ICD-10-CM | POA: Diagnosis not present

## 2021-02-05 DIAGNOSIS — R001 Bradycardia, unspecified: Secondary | ICD-10-CM

## 2021-02-12 ENCOUNTER — Other Ambulatory Visit: Payer: Self-pay | Admitting: *Deleted

## 2021-02-12 NOTE — Patient Outreach (Addendum)
[facility]) Care Management  02/12/2021  Jonathon Tan 04-01-1960 [id number]   Outgoing call placed to member to follow up on stroke recovery, no answer, HIPAA compliant voice message left.  Will follow up within the next 3-4 business days.    Update:  Incoming call received back from member.  Member report he is doing well, follow up appointments scheduled with cardiology on 2/7 and with neurology on 2/8.  He report he has completed 3 week course of Plavix, inquires if he should be taking more.  Reassured that orders were to take it for 3 weeks in addition to aspirin, then aspirin only.  Denies any urgent concerns, encouraged to contact this care manager with questions.  Will close case at this time, no further needs identified.   Valente David, [place], MSN [place] [phone]

## 2021-02-13 ENCOUNTER — Telehealth: Payer: Self-pay | Admitting: Cardiovascular Disease

## 2021-02-13 DIAGNOSIS — R001 Bradycardia, unspecified: Secondary | ICD-10-CM | POA: Diagnosis not present

## 2021-02-13 DIAGNOSIS — E782 Mixed hyperlipidemia: Secondary | ICD-10-CM | POA: Diagnosis not present

## 2021-02-13 DIAGNOSIS — I639 Cerebral infarction, unspecified: Secondary | ICD-10-CM | POA: Diagnosis not present

## 2021-02-13 NOTE — Telephone Encounter (Signed)
Called him to discuss instructions for TEE tomorrow.  Left call back number.

## 2021-02-13 NOTE — Telephone Encounter (Signed)
Patient is having a procedure done tomorrow.  No one has called him with instructions.  He wants to know what time he needs to be there. What time he needs to stop eating and drinking. Where he needs to be at.

## 2021-02-14 ENCOUNTER — Ambulatory Visit (HOSPITAL_COMMUNITY): Payer: BC Managed Care – PPO | Admitting: Certified Registered Nurse Anesthetist

## 2021-02-14 ENCOUNTER — Encounter (HOSPITAL_COMMUNITY): Payer: Self-pay | Admitting: Cardiology

## 2021-02-14 ENCOUNTER — Ambulatory Visit (HOSPITAL_BASED_OUTPATIENT_CLINIC_OR_DEPARTMENT_OTHER): Payer: BC Managed Care – PPO

## 2021-02-14 ENCOUNTER — Encounter (HOSPITAL_COMMUNITY)
Admission: RE | Disposition: A | Discharge status: Home / Self Care | Payer: Self-pay | Source: Home / Self Care | Attending: Cardiology

## 2021-02-14 ENCOUNTER — Other Ambulatory Visit: Payer: Self-pay

## 2021-02-14 ENCOUNTER — Ambulatory Visit (HOSPITAL_COMMUNITY)
Admission: RE | Admit: 2021-02-14 | Discharge: 2021-02-14 | Disposition: A | Discharge status: Home / Self Care | Payer: BC Managed Care – PPO | Attending: Cardiology | Admitting: Cardiology

## 2021-02-14 DIAGNOSIS — R001 Bradycardia, unspecified: Secondary | ICD-10-CM | POA: Diagnosis not present

## 2021-02-14 DIAGNOSIS — I639 Cerebral infarction, unspecified: Secondary | ICD-10-CM | POA: Diagnosis not present

## 2021-02-14 DIAGNOSIS — E785 Hyperlipidemia, unspecified: Secondary | ICD-10-CM | POA: Diagnosis not present

## 2021-02-14 DIAGNOSIS — I440 Atrioventricular block, first degree: Secondary | ICD-10-CM | POA: Diagnosis not present

## 2021-02-14 DIAGNOSIS — I081 Rheumatic disorders of both mitral and tricuspid valves: Secondary | ICD-10-CM | POA: Diagnosis not present

## 2021-02-14 HISTORY — PX: TEE WITHOUT CARDIOVERSION: SHX5443

## 2021-02-14 HISTORY — PX: BUBBLE STUDY: SHX6837

## 2021-02-14 LAB — CBC
Hematocrit: 42.7 % (ref 37.5–51.0)
Hemoglobin: 14.9 g/dL (ref 13.0–17.7)
MCH: 31.5 pg (ref 26.6–33.0)
MCHC: 34.9 g/dL (ref 31.5–35.7)
MCV: 90 fL (ref 79–97)
Platelets: 244 10*3/uL (ref 150–450)
RBC: 4.73 x10E6/uL (ref 4.14–5.80)
RDW: 12 % (ref 11.6–15.4)
WBC: 7.1 10*3/uL (ref 3.4–10.8)

## 2021-02-14 LAB — LIPID PANEL
Chol/HDL Ratio: 2.7 ratio (ref 0.0–5.0)
Cholesterol, Total: 156 mg/dL (ref 100–199)
HDL: 57 mg/dL (ref >39)
LDL Chol Calc (NIH): 70 mg/dL (ref 0–99)
Triglycerides: 175 mg/dL — ABNORMAL HIGH (ref 0–149)
VLDL Cholesterol Cal: 29 mg/dL (ref 5–40)

## 2021-02-14 LAB — HEPATIC FUNCTION PANEL
ALT: 24 IU/L (ref 0–44)
AST: 22 IU/L (ref 0–40)
Albumin: 4.5 g/dL (ref 3.8–4.9)
Alkaline Phosphatase: 68 IU/L (ref 44–121)
Bilirubin Total: 0.2 mg/dL (ref 0.0–1.2)
Bilirubin, Direct: <0.1 mg/dL (ref 0.00–0.40)
Total Protein: 6.5 g/dL (ref 6.0–8.5)

## 2021-02-14 LAB — BASIC METABOLIC PANEL
BUN/Creatinine Ratio: 17 (ref 9–20)
BUN: 19 mg/dL (ref 6–24)
CO2: 23 mmol/L (ref 20–29)
Calcium: 9.3 mg/dL (ref 8.7–10.2)
Chloride: 105 mmol/L (ref 96–106)
Creatinine, Ser: 1.15 mg/dL (ref 0.76–1.27)
Glucose: 72 mg/dL (ref 70–99)
Potassium: 4.4 mmol/L (ref 3.5–5.2)
Sodium: 145 mmol/L — ABNORMAL HIGH (ref 134–144)
eGFR: 74 mL/min/{1.73_m2} (ref >59)

## 2021-02-14 SURGERY — ECHOCARDIOGRAM, TRANSESOPHAGEAL
Anesthesia: Monitor Anesthesia Care

## 2021-02-14 MED ORDER — PROPOFOL 500 MG/50ML IV EMUL
INTRAVENOUS | Status: DC | PRN
  Administered 2021-02-14: 100 ug/kg/min via INTRAVENOUS

## 2021-02-14 MED ORDER — BUTAMBEN-TETRACAINE-BENZOCAINE 2-2-14 % EX AERO
INHALATION_SPRAY | CUTANEOUS | Status: DC | PRN
  Administered 2021-02-14: 2 via TOPICAL

## 2021-02-14 MED ORDER — SODIUM CHLORIDE 0.9 % IV SOLN: INTRAVENOUS | Status: DC

## 2021-02-14 MED ORDER — DEXAMETHASONE SODIUM PHOSPHATE 10 MG/ML IJ SOLN
INTRAMUSCULAR | Status: DC | PRN
  Administered 2021-02-14: 10 mg via INTRAVENOUS

## 2021-02-14 MED ORDER — PROPOFOL 10 MG/ML IV BOLUS
INTRAVENOUS | Status: DC | PRN
  Administered 2021-02-14 (×2): 30 mg via INTRAVENOUS
  Administered 2021-02-14: 20 mg via INTRAVENOUS
  Administered 2021-02-14 (×4): 30 mg via INTRAVENOUS

## 2021-02-14 NOTE — Procedures (Signed)
° ° ° °  Transesophageal Echocardiogram Note  Starlin Steib [id number] [birth year]  Procedure: Transesophageal Echocardiogram Indications: Stroke  Procedure Details Consent: Obtained Time Out: Verified patient identification, verified procedure, site/side was marked, verified correct patient position, special equipment/implants available, Radiology Safety Procedures followed,  medications/allergies/relevent history reviewed, required imaging and test results available.  Performed  Medications: Propofol: 575mg    Left Ventrical:  LVEF 60-65%  Mitral Valve: Normal structure, trivial MR  Aortic Valve: Tricuspid, no AI  Tricuspid Valve: Normal structure, trivial TR  Pulmonic Valve: Normal structure, mild PI  Left Atrium/ Left atrial appendage: No LAA appendage thrombus  Atrial septum: PFO visualized by color doppler. Agitated saline contrast bubble study positive for interatrial shunt  Aorta: No significant plaque   Complications: No apparent complications Patient did tolerate procedure well.  Freada Bergeron, MD 02/14/2021, 3:12 PM

## 2021-02-14 NOTE — Progress Notes (Signed)
°  Echocardiogram Echocardiogram Transesophageal has been performed.  Johny Chess 02/14/2021, 3:32 PM

## 2021-02-14 NOTE — Anesthesia Preprocedure Evaluation (Addendum)
Anesthesia Evaluation  Patient identified by MRN, date of birth, ID band Patient awake    Reviewed: Allergy & Precautions, NPO status , Patient's Chart, lab work & pertinent test results  History of Anesthesia Complications Negative for: history of anesthetic complications  Airway Mallampati: II  TM Distance: >3 FB Neck ROM: Full    Dental  (+) Teeth Intact   Pulmonary neg pulmonary ROS,    Pulmonary exam normal        Cardiovascular negative cardio ROS Normal cardiovascular exam   Echo 01/21/21: 1. Left ventricular ejection fraction, by estimation, is 55 to 60%. The left ventricle has normal function. The left ventricle has no regional wall motion abnormalities. There is mild left ventricular hypertrophy. Left ventricular diastolic parameters  are consistent with Grade I diastolic dysfunction (impaired relaxation). 2. Right ventricular systolic function is normal. The right ventricular size is normal. Tricuspid regurgitation signal is inadequate for assessing PA pressure. 3. Left atrial size was mild to moderately dilated. 4. The mitral valve is normal in structure. Trivial mitral valve regurgitation. No evidence of mitral stenosis. 5. The aortic valve is tricuspid. Aortic valve regurgitation is not visualized. No aortic stenosis is present. 6. The inferior vena cava is normal in size with greater than 50% respiratory variability, suggesting right atrial pressure of 3 mmHg.   Neuro/Psych CVA    GI/Hepatic negative GI ROS, Neg liver ROS,   Endo/Other  negative endocrine ROS  Renal/GU negative Renal ROS  negative genitourinary   Musculoskeletal negative musculoskeletal ROS (+)   Abdominal   Peds  Hematology negative hematology ROS (+)   Anesthesia Other Findings   Reproductive/Obstetrics                            Anesthesia Physical Anesthesia Plan  ASA: 4  Anesthesia Plan: MAC    Post-op Pain Management: Minimal or no pain anticipated   Induction: Intravenous  PONV Risk Score and Plan: 1 and Propofol infusion, TIVA and Treatment may vary due to age or medical condition  Airway Management Planned: Natural Airway, Nasal Cannula and Simple Face Mask  Additional Equipment: None  Intra-op Plan:   Post-operative Plan:   Informed Consent: I have reviewed the patients History and Physical, chart, labs and discussed the procedure including the risks, benefits and alternatives for the proposed anesthesia with the patient or authorized representative who has indicated his/her understanding and acceptance.       Plan Discussed with:   Anesthesia Plan Comments:        Anesthesia Quick Evaluation

## 2021-02-14 NOTE — Telephone Encounter (Signed)
Patient had TEE completed today 12/16. Will remove call from triage.

## 2021-02-14 NOTE — Interval H&P Note (Signed)
History and Physical Interval Note:  02/14/2021 2:26 PM  Mark Mclaughlin  has presented today for surgery, with the diagnosis of SHUNT.  The various methods of treatment have been discussed with the patient and family. After consideration of risks, benefits and other options for treatment, the patient has consented to  Procedure(s): TRANSESOPHAGEAL ECHOCARDIOGRAM (TEE) (N/A) as a surgical intervention.  The patient's history has been reviewed, patient examined, no change in status, stable for surgery.  I have reviewed the patient's chart and labs.  Questions were answered to the patient's satisfaction.     Freada Bergeron

## 2021-02-14 NOTE — Anesthesia Procedure Notes (Signed)
Procedure Name: MAC Date/Time: 02/14/2021 2:37 PM Performed by: Carolan Clines, CRNA Pre-anesthesia Checklist: Patient identified, Emergency Drugs available, Suction available and Patient being monitored Patient Re-evaluated:Patient Re-evaluated prior to induction Oxygen Delivery Method: Nasal cannula Dental Injury: Teeth and Oropharynx as per pre-operative assessment

## 2021-02-14 NOTE — Transfer of Care (Signed)
Immediate Anesthesia Transfer of Care Note  Patient: Mark Mclaughlin  Procedure(s) Performed: TRANSESOPHAGEAL ECHOCARDIOGRAM (TEE) BUBBLE STUDY  Patient Location: PACU  Anesthesia Type:MAC  Level of Consciousness: drowsy  Airway & Oxygen Therapy: Patient Spontanous Breathing and Patient connected to nasal cannula oxygen  Post-op Assessment: Report given to RN and Post -op Vital signs reviewed and stable  Post vital signs: Reviewed and stable  Last Vitals:  Vitals Value Taken Time  BP 110/71 02/14/21 1529  Temp 36.6 C 02/14/21 1525  Pulse 56 02/14/21 1534  Resp 19 02/14/21 1534  SpO2 96 % 02/14/21 1534  Vitals shown include unvalidated device data.  Last Pain:  Vitals:   02/14/21 1525  TempSrc:   PainSc: 0-No pain         Complications: No notable events documented.

## 2021-02-16 ENCOUNTER — Encounter (HOSPITAL_COMMUNITY): Payer: Self-pay | Admitting: Cardiology

## 2021-02-17 NOTE — Anesthesia Postprocedure Evaluation (Signed)
Anesthesia Post Note  Patient: Mark Mclaughlin  Procedure(s) Performed: TRANSESOPHAGEAL ECHOCARDIOGRAM (TEE) BUBBLE STUDY     Anesthesia Type: MAC Level of consciousness: awake and alert Pain management: pain level controlled Vital Signs Assessment: post-procedure vital signs reviewed and stable Respiratory status: spontaneous breathing, nonlabored ventilation and respiratory function stable Cardiovascular status: blood pressure returned to baseline and stable Postop Assessment: no apparent nausea or vomiting Anesthetic complications: no   No notable events documented.  Last Vitals:  Vitals:   02/14/21 1525 02/14/21 1540  BP: 110/71 121/78  Pulse: (!) 57 (!) 57  Resp: (!) 21 17  Temp: 36.6 C 36.5 C  SpO2: 97% 98%    Last Pain:  Vitals:   02/14/21 1525  TempSrc:   PainSc: 0-No pain                 Lidia Collum

## 2021-03-11 ENCOUNTER — Telehealth: Payer: Self-pay

## 2021-03-11 NOTE — Progress Notes (Signed)
Kindly inform the patient that external cardiac heart monitor study did not show any significant worrisome arrhythmias.

## 2021-03-11 NOTE — Telephone Encounter (Signed)
I called patient. I advised him of his cardiac monitor results. Pt verbalized understanding of results. Pt had no questions at this time but was encouraged to call back if questions arise.

## 2021-03-11 NOTE — Telephone Encounter (Signed)
-----   Message from Garvin Fila, MD sent at 03/11/2021  4:33 PM EST ----- Kindly inform the patient that external cardiac heart monitor study did not show any significant worrisome arrhythmias.

## 2021-03-12 ENCOUNTER — Telehealth: Payer: Self-pay

## 2021-03-12 NOTE — Telephone Encounter (Signed)
Rescheduled the patient from 1/17 to 1/12 for consult with Dr. Burt Knack. Since the patient is very eager to have closure, tentatively scheduled him 04/07/21 for procedure. The patient understands procedure is pending discussion with Dr. Burt Knack. He was grateful for call and agrees with plan.

## 2021-03-13 ENCOUNTER — Other Ambulatory Visit: Payer: Self-pay

## 2021-03-13 ENCOUNTER — Encounter: Payer: Self-pay | Admitting: Cardiovascular Disease

## 2021-03-13 ENCOUNTER — Ambulatory Visit (INDEPENDENT_AMBULATORY_CARE_PROVIDER_SITE_OTHER): Payer: BC Managed Care – PPO | Admitting: Cardiovascular Disease

## 2021-03-13 VITALS — BP 120/90 | HR 47 | Ht 70.0 in | Wt 186.8 lb

## 2021-03-13 DIAGNOSIS — E782 Mixed hyperlipidemia: Secondary | ICD-10-CM

## 2021-03-13 DIAGNOSIS — Z01818 Encounter for other preprocedural examination: Secondary | ICD-10-CM | POA: Diagnosis not present

## 2021-03-13 DIAGNOSIS — Z01812 Encounter for preprocedural laboratory examination: Secondary | ICD-10-CM

## 2021-03-13 DIAGNOSIS — R001 Bradycardia, unspecified: Secondary | ICD-10-CM

## 2021-03-13 DIAGNOSIS — Q2112 Patent foramen ovale: Secondary | ICD-10-CM

## 2021-03-13 MED ORDER — CLOPIDOGREL BISULFATE 75 MG PO TABS: 75.0000 mg | ORAL_TABLET | Freq: Every day | ORAL | 3 refills | Status: DC

## 2021-03-13 NOTE — Progress Notes (Signed)
Cardiology Office Note:    Date:  03/16/2021   ID:  Mark Mclaughlin, DOB December 28, 1960, MRN [id number]  PCP:  Caren Macadam, MD   [facility] Providers Cardiologist:  None     Referring MD: Caren Macadam, MD   Chief Complaint  Patient presents with   PFO Consult    History of Present Illness:    Mark Mclaughlin is a 61 y.o. male with a hx of ischemic stroke in November 2022, found to have PFO on stroke evaluation, referred for consultation regarding PFO closure.  The patient presented with acute onset of left arm numbness.  He had been in his normal state of health prior to this.  When he presented to the emergency department, a code stroke was called.  He was out of the window for tPA.  An MRI of the brain showed a right frontoparietal stroke.  The patient was evaluated by neurology in the hospital.  An echocardiogram showed right to left shunting and was otherwise normal.  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  He returned for outpatient transesophageal echo which showed normal LV and RV function, no significant valvular disease, and a large PFO with right to left shunting.  The patient is here alone today.  He has no complaints at present.  He specifically denies chest pain, heart palpitations, orthopnea, or PND.  He is physically very active and he has a runner.  He runs 6 miles during the week and longer than that on weekends.  He has been a runner for many years and denies any exertional chest pain or pressure.  He does have mild shortness of breath during the first 3 miles of his ride and then this resolves as he continues on.  No residual stroke related symptoms.    Past Medical History:  Diagnosis Date   Hyperlipidemia     Past Surgical History:  Procedure Laterality Date   ANTERIOR CRUCIATE LIGAMENT REPAIR     BUBBLE STUDY  02/14/2021   Procedure: BUBBLE STUDY;  Surgeon: Freada Bergeron, MD;  Location: [facility]  ENDOSCOPY;  Service: Cardiovascular;;   TEE WITHOUT CARDIOVERSION N/A 02/14/2021   Procedure: TRANSESOPHAGEAL ECHOCARDIOGRAM (TEE);  Surgeon: Freada Bergeron, MD;  Location: [facility] ENDOSCOPY;  Service: Cardiovascular;  Laterality: N/A;    Current Medications: Current Meds  Medication Sig   aspirin EC 81 MG EC tablet Take 1 tablet (81 mg total) by mouth daily. Swallow whole.   clopidogrel (PLAVIX) 75 MG tablet Take 1 tablet (75 mg total) by mouth daily.   Multiple Vitamin (MULTIVITAMIN) tablet Take 1 tablet by mouth daily with supper.   Omega-3 Fatty Acids (OMEGA 3 PO) Take 1 capsule by mouth daily with supper.   pravastatin (PRAVACHOL) 80 MG tablet Take 1 tablet (80 mg total) by mouth daily at 6 PM. (Patient taking differently: Take 40 mg by mouth daily at 6 PM. Per patient taking 40 mg)     Allergies:   Lipitor [atorvastatin]   Social History   Socioeconomic History   Marital status: Married    Spouse name: Not on file   Number of children: Not on file   Years of education: Not on file   Highest education level: Not on file  Occupational History   Not on file  Tobacco Use   Smoking status: Never   Smokeless tobacco: Not on file  Substance and Sexual Activity   Alcohol use: No   Drug use: No  Sexual activity: Not on file  Other Topics Concern   Not on file  Social History Narrative   Not on file   Social Determinants of Health   Financial Resource Strain: Low Risk    Difficulty of Paying Living Expenses: Not hard at all  Food Insecurity: No Food Insecurity   Worried About Running Out of Food in the Last Year: Never true   [place] in the Last Year: Never true  Transportation Needs: No Transportation Needs   Lack of Transportation (Medical): No   Lack of Transportation (Non-Medical): No  Physical Activity: Not on file  Stress: No Stress Concern Present   Feeling of Stress : Not at all  Social Connections: Not on file     Family History: The patient's  family history includes Cancer in his father; Heart attack in his maternal grandfather and paternal uncle.  ROS:   Please see the history of present illness.    All other systems reviewed and are negative.  EKGs/Labs/Other Studies Reviewed:    The following studies were reviewed today: Transcranial Doppler: TCD bubble at rest and during valsalva: Spencer grade 4    Positive TCD Bubble study indicative of large ( grade 4 Spencer ) right to  left shunt  *See table(s) above for TCD measurements and observations.   Event Monitor: 1. SR/SB (sometimes in 40s during AM hours)/ST 2. Occasional PVCs  TEE:  1. Evidence of atrial level shunting detected by color flow Doppler best  seen on clip 31 and freeze frame on clip 32. Agitated saline contrast  bubble study was positive with shunting observed within 3-6 cardiac cycles  suggestive of interatrial shunt  (clip 37). Given recent stroke, recommend referral to structural heart  team for possible PFO closure.   2. Left ventricular ejection fraction, by estimation, is 60 to 65%. The  left ventricle has normal function.   3. Right ventricular systolic function is normal. The right ventricular  size is normal.   4. Left atrial size was mildly dilated. No left atrial/left atrial  appendage thrombus was detected.   5. The mitral valve is normal in structure. Trivial mitral valve  regurgitation.   6. The aortic valve is tricuspid. Aortic valve regurgitation is not  visualized. No aortic stenosis is present.   EKG:  EKG is ordered today.  The ekg ordered today demonstrates sinus bradycardia 47 bpm, marked T wave abnormality consider anterior ischemia, no change from previous.  Recent Labs: 01/21/2021: Magnesium 1.9; TSH 1.190 02/13/2021: ALT 24; BUN 19; Creatinine, Ser 1.15; Hemoglobin 14.9; Platelets 244; Potassium 4.4; Sodium 145  Recent Lipid Panel    Component Value Date/Time   CHOL 156 02/13/2021 1711   TRIG 175 (H) 02/13/2021 1711    HDL 57 02/13/2021 1711   CHOLHDL 2.7 02/13/2021 1711   CHOLHDL 3.1 01/21/2021 1221   VLDL 12 01/21/2021 1221   LDLCALC 70 02/13/2021 1711     Risk Assessment/Calculations:           Physical Exam:    VS:  BP 120/90    Pulse (!) 47    Ht 5\' 10"  (1.778 m)    Wt 186 lb 12.8 oz (84.7 kg)    SpO2 95%    BMI 26.80 kg/m     Wt Readings from Last 3 Encounters:  03/13/21 186 lb 12.8 oz (84.7 kg)  02/14/21 180 lb (81.6 kg)  01/28/21 187 lb 9.6 oz (85.1 kg)     GEN:  Well  nourished, well developed in no acute distress HEENT: Normal NECK: No JVD; No carotid bruits LYMPHATICS: No lymphadenopathy CARDIAC: bradycardic and regular, no murmurs, rubs, gallops RESPIRATORY:  Clear to auscultation without rales, wheezing or rhonchi  ABDOMEN: Soft, non-tender, non-distended MUSCULOSKELETAL:  No edema; No deformity  SKIN: Warm and dry NEUROLOGIC:  Alert and oriented x 3 PSYCHIATRIC:  Normal affect   ASSESSMENT:    1. Mixed hyperlipidemia   2. Bradycardia   3. PFO (patent foramen ovale)   4. Pre-procedure lab exam    PLAN:    In order of problems listed above:  Treated with pravastatin, intolerant to high potency statins Asymptomatic, likely related to high vagal tone in this long-distance runner The patient has a large PFO.  I personally reviewed his TEE images which show large right to left shunt confirmed by echo bubble study.  There is color flow seen through the PFO as well. ROPE Score is 7, with PFO anatomy that places him at higher risk.  I reviewed available data regarding transcatheter PFO closure with the patient today.  I demonstrated the procedure and the Amplatzer PFO occluder device to him.  I reviewed potential risks, indications, and alternatives to PFO closure including ongoing medical/antiplatelet therapy as an alternative.  The risks of PFO closure, including vascular injury, bleeding, infection, stroke, myocardial infarction, device embolization, late device erosion,  arrhythmia, emergency surgery, cardiac injury with tamponade, and death, are all reviewed with the patient.  He understands the combined risk of these life-threatening complications are low and occur at an incidence of 1% or less.  The patient would like to proceed.  He will be started on clopidogrel 75 mg daily which he understands will be continued for 6 months after the procedure.           Medication Adjustments/Labs and Tests Ordered: Current medicines are reviewed at length with the patient today.  Concerns regarding medicines are outlined above.  Orders Placed This Encounter  Procedures   CBC   Basic metabolic panel   EKG [id number]   Meds ordered this encounter  Medications   clopidogrel (PLAVIX) 75 MG tablet    Sig: Take 1 tablet (75 mg total) by mouth daily.    Dispense:  90 tablet    Refill:  3       Signed, Sherren Mocha, MD  03/16/2021 10:35 AM    [place]

## 2021-03-13 NOTE — Patient Instructions (Addendum)
Medication Instructions:  Plavix 75mg  once daily *If you need a refill on your cardiac medications before your next appointment, please call your pharmacy*   Lab Work: BMET, CBC prior to procedure.  See instruction letter.  If you have labs (blood work) drawn today and your tests are completely normal, you will receive your results only by: [place] (if you have MyChart) OR A paper copy in the mail If you have any lab test that is abnormal or we need to change your treatment, we will call you to review the results.   Testing/Procedures: Your physician recommends that you have a PFO closure performed.  See next page for instructions.     Other Instructions  CLOSURE INSTRUCTIONS: You are scheduled for a PFO/ASD CLOSURE on Monday, February 6 with Dr. Sherren Mocha.  1. Please arrive at the [facility] (Main Entrance A) at [facility]: [address] [place], [place] [address] at 5:30 AM (This time is two hours before your procedure to ensure your preparation). Free valet parking service is available. You are allowed ONE visitor in the waiting room during your procedure. Both you and your guest must wear masks. Special note: Every effort is made to have your procedure done on time. Please understand that emergencies sometimes delay scheduled procedures.  2. Diet: Make sure to stay well hydrated the day before your procedure! Do not eat solid foods after midnight.  You may have clear liquids until 5am upon the day of the procedure.  3. Labs: You will need to have blood drawn (CBC, BMET within 30 days of procedure) on Wednesday, February 1. You do not need to be fasting. Lab is open from 7:15am-5pm.  4. Medication instructions in preparation for your procedure:  1) If on Eliquis, HOLD ELIQUIS the night before and morning of your procedure (REMOVE if not on Eliquis)  2) MAKE SURE TO TAKE YOUR ASPIRIN AND PLAVIX the morning of your closure  3) Hold diuretics the morning of  procedure.  4) Other meds may be taken as directed with a sip of water.  5. Plan for one night stay--bring personal belongings (this is a disclaimer, not an intention. We want you to go home!). 6. Bring a current list of your medications and current insurance cards. 7. You MUST have a responsible person to drive you home. 8. Someone MUST be with you the first 24 hours after you arrive home or your discharge will be delayed. 9. Please wear clothes that are easy to get on and off and wear slip-on shoes.   FOLLOW-UP APPOINTMENT: You are scheduled for your 1 month follow-up on Monday, March 6 at [place] with Structural Heart PA or NP. [place] [address]

## 2021-03-13 NOTE — H&P (View-Only) (Signed)
Cardiology Office Note:    Date:  03/16/2021   ID:  Mark Mclaughlin, DOB [birth year], MRN [id number]  PCP:  Caren Macadam, MD   [facility] Providers Cardiologist:  None     Referring MD: Caren Macadam, MD   Chief Complaint  Patient presents with   PFO Consult    History of Present Illness:    Mark Mclaughlin is a 62 y.o. male with a hx of ischemic stroke in November 2022, found to have PFO on stroke evaluation, referred for consultation regarding PFO closure.  The patient presented with acute onset of left arm numbness.  He had been in his normal state of health prior to this.  When he presented to the emergency department, a code stroke was called.  He was out of the window for tPA.  An MRI of the brain showed a right frontoparietal stroke.  The patient was evaluated by neurology in the hospital.  An echocardiogram showed right to left shunting and was otherwise normal.  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  He returned for outpatient transesophageal echo which showed normal LV and RV function, no significant valvular disease, and a large PFO with right to left shunting.  The patient is here alone today.  He has no complaints at present.  He specifically denies chest pain, heart palpitations, orthopnea, or PND.  He is physically very active and he has a runner.  He runs 6 miles during the week and longer than that on weekends.  He has been a runner for many years and denies any exertional chest pain or pressure.  He does have mild shortness of breath during the first 3 miles of his ride and then this resolves as he continues on.  No residual stroke related symptoms.    Past Medical History:  Diagnosis Date   Hyperlipidemia     Past Surgical History:  Procedure Laterality Date   ANTERIOR CRUCIATE LIGAMENT REPAIR     BUBBLE STUDY  02/14/2021   Procedure: BUBBLE STUDY;  Surgeon: Freada Bergeron, MD;  Location: [facility]  ENDOSCOPY;  Service: Cardiovascular;;   TEE WITHOUT CARDIOVERSION N/A 02/14/2021   Procedure: TRANSESOPHAGEAL ECHOCARDIOGRAM (TEE);  Surgeon: Freada Bergeron, MD;  Location: [facility] ENDOSCOPY;  Service: Cardiovascular;  Laterality: N/A;    Current Medications: Current Meds  Medication Sig   aspirin EC 81 MG EC tablet Take 1 tablet (81 mg total) by mouth daily. Swallow whole.   clopidogrel (PLAVIX) 75 MG tablet Take 1 tablet (75 mg total) by mouth daily.   Multiple Vitamin (MULTIVITAMIN) tablet Take 1 tablet by mouth daily with supper.   Omega-3 Fatty Acids (OMEGA 3 PO) Take 1 capsule by mouth daily with supper.   pravastatin (PRAVACHOL) 80 MG tablet Take 1 tablet (80 mg total) by mouth daily at 6 PM. (Patient taking differently: Take 40 mg by mouth daily at 6 PM. Per patient taking 40 mg)     Allergies:   Lipitor [atorvastatin]   Social History   Socioeconomic History   Marital status: Married    Spouse name: Not on file   Number of children: Not on file   Years of education: Not on file   Highest education level: Not on file  Occupational History   Not on file  Tobacco Use   Smoking status: Never   Smokeless tobacco: Not on file  Substance and Sexual Activity   Alcohol use: No   Drug use: No  Sexual activity: Not on file  Other Topics Concern   Not on file  Social History Narrative   Not on file   Social Determinants of Health   Financial Resource Strain: Low Risk    Difficulty of Paying Living Expenses: Not hard at all  Food Insecurity: No Food Insecurity   Worried About Running Out of Food in the Last Year: Never true   [place] in the Last Year: Never true  Transportation Needs: No Transportation Needs   Lack of Transportation (Medical): No   Lack of Transportation (Non-Medical): No  Physical Activity: Not on file  Stress: No Stress Concern Present   Feeling of Stress : Not at all  Social Connections: Not on file     Family History: The patient's  family history includes Cancer in his father; Heart attack in his maternal grandfather and paternal uncle.  ROS:   Please see the history of present illness.    All other systems reviewed and are negative.  EKGs/Labs/Other Studies Reviewed:    The following studies were reviewed today: Transcranial Doppler: TCD bubble at rest and during valsalva: Spencer grade 4    Positive TCD Bubble study indicative of large ( grade 4 Spencer ) right to  left shunt  *See table(s) above for TCD measurements and observations.   Event Monitor: 1. SR/SB (sometimes in 40s during AM hours)/ST 2. Occasional PVCs  TEE:  1. Evidence of atrial level shunting detected by color flow Doppler best  seen on clip 31 and freeze frame on clip 32. Agitated saline contrast  bubble study was positive with shunting observed within 3-6 cardiac cycles  suggestive of interatrial shunt  (clip 37). Given recent stroke, recommend referral to structural heart  team for possible PFO closure.   2. Left ventricular ejection fraction, by estimation, is 60 to 65%. The  left ventricle has normal function.   3. Right ventricular systolic function is normal. The right ventricular  size is normal.   4. Left atrial size was mildly dilated. No left atrial/left atrial  appendage thrombus was detected.   5. The mitral valve is normal in structure. Trivial mitral valve  regurgitation.   6. The aortic valve is tricuspid. Aortic valve regurgitation is not  visualized. No aortic stenosis is present.   EKG:  EKG is ordered today.  The ekg ordered today demonstrates sinus bradycardia 47 bpm, marked T wave abnormality consider anterior ischemia, no change from previous.  Recent Labs: 01/21/2021: Magnesium 1.9; TSH 1.190 02/13/2021: ALT 24; BUN 19; Creatinine, Ser 1.15; Hemoglobin 14.9; Platelets 244; Potassium 4.4; Sodium 145  Recent Lipid Panel    Component Value Date/Time   CHOL 156 02/13/2021 1711   TRIG 175 (H) 02/13/2021 1711    HDL 57 02/13/2021 1711   CHOLHDL 2.7 02/13/2021 1711   CHOLHDL 3.1 01/21/2021 1221   VLDL 12 01/21/2021 1221   LDLCALC 70 02/13/2021 1711     Risk Assessment/Calculations:           Physical Exam:    VS:  BP 120/90    Pulse (!) 47    Ht 5\' 10"  (1.778 m)    Wt 186 lb 12.8 oz (84.7 kg)    SpO2 95%    BMI 26.80 kg/m     Wt Readings from Last 3 Encounters:  03/13/21 186 lb 12.8 oz (84.7 kg)  02/14/21 180 lb (81.6 kg)  01/28/21 187 lb 9.6 oz (85.1 kg)     GEN:  Well  nourished, well developed in no acute distress HEENT: Normal NECK: No JVD; No carotid bruits LYMPHATICS: No lymphadenopathy CARDIAC: bradycardic and regular, no murmurs, rubs, gallops RESPIRATORY:  Clear to auscultation without rales, wheezing or rhonchi  ABDOMEN: Soft, non-tender, non-distended MUSCULOSKELETAL:  No edema; No deformity  SKIN: Warm and dry NEUROLOGIC:  Alert and oriented x 3 PSYCHIATRIC:  Normal affect   ASSESSMENT:    1. Mixed hyperlipidemia   2. Bradycardia   3. PFO (patent foramen ovale)   4. Pre-procedure lab exam    PLAN:    In order of problems listed above:  Treated with pravastatin, intolerant to high potency statins Asymptomatic, likely related to high vagal tone in this long-distance runner The patient has a large PFO.  I personally reviewed his TEE images which show large right to left shunt confirmed by echo bubble study.  There is color flow seen through the PFO as well. ROPE Score is 7, with PFO anatomy that places him at higher risk.  I reviewed available data regarding transcatheter PFO closure with the patient today.  I demonstrated the procedure and the Amplatzer PFO occluder device to him.  I reviewed potential risks, indications, and alternatives to PFO closure including ongoing medical/antiplatelet therapy as an alternative.  The risks of PFO closure, including vascular injury, bleeding, infection, stroke, myocardial infarction, device embolization, late device erosion,  arrhythmia, emergency surgery, cardiac injury with tamponade, and death, are all reviewed with the patient.  He understands the combined risk of these life-threatening complications are low and occur at an incidence of 1% or less.  The patient would like to proceed.  He will be started on clopidogrel 75 mg daily which he understands will be continued for 6 months after the procedure.           Medication Adjustments/Labs and Tests Ordered: Current medicines are reviewed at length with the patient today.  Concerns regarding medicines are outlined above.  Orders Placed This Encounter  Procedures   CBC   Basic metabolic panel   EKG [id number]   Meds ordered this encounter  Medications   clopidogrel (PLAVIX) 75 MG tablet    Sig: Take 1 tablet (75 mg total) by mouth daily.    Dispense:  90 tablet    Refill:  3       Signed, Sherren Mocha, MD  03/16/2021 10:35 AM    [place]

## 2021-03-18 ENCOUNTER — Institutional Professional Consult (permissible substitution): Payer: BC Managed Care – PPO | Admitting: Cardiovascular Disease

## 2021-04-02 ENCOUNTER — Other Ambulatory Visit: Payer: Self-pay

## 2021-04-02 ENCOUNTER — Other Ambulatory Visit: Payer: BC Managed Care – PPO | Admitting: *Deleted

## 2021-04-02 DIAGNOSIS — Z01812 Encounter for preprocedural laboratory examination: Secondary | ICD-10-CM | POA: Diagnosis not present

## 2021-04-02 DIAGNOSIS — Q2112 Patent foramen ovale: Secondary | ICD-10-CM

## 2021-04-02 LAB — BASIC METABOLIC PANEL
BUN/Creatinine Ratio: 15 (ref 9–20)
BUN: 18 mg/dL (ref 6–24)
CO2: 23 mmol/L (ref 20–29)
Calcium: 9.9 mg/dL (ref 8.7–10.2)
Chloride: 102 mmol/L (ref 96–106)
Creatinine, Ser: 1.2 mg/dL (ref 0.76–1.27)
Glucose: 88 mg/dL (ref 70–99)
Potassium: 4.5 mmol/L (ref 3.5–5.2)
Sodium: 140 mmol/L (ref 134–144)
eGFR: 70 mL/min/{1.73_m2} (ref >59)

## 2021-04-02 LAB — CBC
Hematocrit: 47.3 % (ref 37.5–51.0)
Hemoglobin: 16 g/dL (ref 13.0–17.7)
MCH: 30.7 pg (ref 26.6–33.0)
MCHC: 33.8 g/dL (ref 31.5–35.7)
MCV: 91 fL (ref 79–97)
Platelets: 275 10*3/uL (ref 150–450)
RBC: 5.21 x10E6/uL (ref 4.14–5.80)
RDW: 11.9 % (ref 11.6–15.4)
WBC: 6.5 10*3/uL (ref 3.4–10.8)

## 2021-04-03 ENCOUNTER — Telehealth: Payer: Self-pay | Admitting: *Deleted

## 2021-04-03 NOTE — Telephone Encounter (Signed)
PFO closure scheduled at [facility] for: Monday April 07, 2021 7:30 [facility] Main Entrance A [facility]) at: 5:30 AM   Diet-no solid food after midnight prior to cath, clear liquids until 5 AM day of procedure.  Medication instructions for procedure: -Usual morning medications can be taken pre-cath with sips of water including aspirin 81 mg and Plavix 75 mg.    Must have responsible adult to drive home post procedure and be with patient first 24 hours after arriving home.  [facility] does allow one visitor to accompany you and wait in the hospital waiting room while you are there for your procedure. You and your visitor will be asked to wear a mask once you enter the hospital.   Patient reports does not currently have any new symptoms concerning for COVID-19 and no household members with COVID-19 like illness.     Reviewed procedure instructions with patient.

## 2021-04-07 ENCOUNTER — Ambulatory Visit (HOSPITAL_BASED_OUTPATIENT_CLINIC_OR_DEPARTMENT_OTHER): Payer: BC Managed Care – PPO

## 2021-04-07 ENCOUNTER — Ambulatory Visit (HOSPITAL_COMMUNITY)
Admission: RE | Disposition: A | Discharge status: Home / Self Care | Payer: Self-pay | Source: Home / Self Care | Attending: Cardiovascular Disease

## 2021-04-07 ENCOUNTER — Ambulatory Visit (HOSPITAL_COMMUNITY)
Admission: RE | Admit: 2021-04-07 | Discharge: 2021-04-07 | Disposition: A | Discharge status: Home / Self Care | Payer: BC Managed Care – PPO | Attending: Cardiovascular Disease | Admitting: Cardiovascular Disease

## 2021-04-07 DIAGNOSIS — Z79899 Other long term (current) drug therapy: Secondary | ICD-10-CM | POA: Insufficient documentation

## 2021-04-07 DIAGNOSIS — R001 Bradycardia, unspecified: Secondary | ICD-10-CM | POA: Diagnosis not present

## 2021-04-07 DIAGNOSIS — Q211 Atrial septal defect, unspecified: Secondary | ICD-10-CM | POA: Diagnosis not present

## 2021-04-07 DIAGNOSIS — Q2112 Patent foramen ovale: Secondary | ICD-10-CM

## 2021-04-07 DIAGNOSIS — E782 Mixed hyperlipidemia: Secondary | ICD-10-CM | POA: Diagnosis not present

## 2021-04-07 HISTORY — PX: PATENT FORAMEN OVALE(PFO) CLOSURE: CATH118300

## 2021-04-07 LAB — ECHOCARDIOGRAM LIMITED
Height: 70 in
Weight: 2928 oz

## 2021-04-07 LAB — POCT ACTIVATED CLOTTING TIME: Activated Clotting Time: 221 seconds

## 2021-04-07 SURGERY — PATENT FORAMEN OVALE (PFO) CLOSURE
Anesthesia: LOCAL

## 2021-04-07 MED ORDER — SODIUM CHLORIDE 0.9 % IV SOLN: 250.0000 mL | INTRAVENOUS | Status: DC | PRN

## 2021-04-07 MED ORDER — MIDAZOLAM HCL 2 MG/2ML IJ SOLN
INTRAMUSCULAR | Status: DC | PRN
  Administered 2021-04-07 (×3): 2 mg via INTRAVENOUS

## 2021-04-07 MED ORDER — ACETAMINOPHEN 325 MG PO TABS: 650.0000 mg | ORAL_TABLET | ORAL | Status: DC | PRN

## 2021-04-07 MED ORDER — FENTANYL CITRATE (PF) 100 MCG/2ML IJ SOLN
INTRAMUSCULAR | Status: AC
  Filled 2021-04-07: qty 2

## 2021-04-07 MED ORDER — HEPARIN (PORCINE) IN NACL 1000-0.9 UT/500ML-% IV SOLN
INTRAVENOUS | Status: DC | PRN
  Administered 2021-04-07 (×3): 500 mL

## 2021-04-07 MED ORDER — SODIUM CHLORIDE 0.9% FLUSH: 3.0000 mL | INTRAVENOUS | Status: DC | PRN

## 2021-04-07 MED ORDER — SODIUM CHLORIDE 0.9% FLUSH: 3.0000 mL | Freq: Two times a day (BID) | INTRAVENOUS | Status: DC

## 2021-04-07 MED ORDER — SODIUM CHLORIDE 0.9 % WEIGHT BASED INFUSION: 1.0000 mL/kg/h | INTRAVENOUS | Status: DC

## 2021-04-07 MED ORDER — CEFAZOLIN SODIUM-DEXTROSE 2-4 GM/100ML-% IV SOLN
INTRAVENOUS | Status: AC
  Filled 2021-04-07: qty 100

## 2021-04-07 MED ORDER — HEPARIN SODIUM (PORCINE) 1000 UNIT/ML IJ SOLN
INTRAMUSCULAR | Status: DC | PRN
  Administered 2021-04-07: 7000 [IU] via INTRAVENOUS
  Administered 2021-04-07: 2000 [IU] via INTRAVENOUS

## 2021-04-07 MED ORDER — HEPARIN (PORCINE) IN NACL 1000-0.9 UT/500ML-% IV SOLN
INTRAVENOUS | Status: AC
  Filled 2021-04-07: qty 1500

## 2021-04-07 MED ORDER — SODIUM CHLORIDE 0.9 % WEIGHT BASED INFUSION
3.0000 mL/kg/h | INTRAVENOUS | Status: AC
  Administered 2021-04-07: 3 mL/kg/h via INTRAVENOUS

## 2021-04-07 MED ORDER — MIDAZOLAM HCL 2 MG/2ML IJ SOLN
INTRAMUSCULAR | Status: AC
  Filled 2021-04-07: qty 2

## 2021-04-07 MED ORDER — ASPIRIN 81 MG PO CHEW: 81.0000 mg | CHEWABLE_TABLET | ORAL | Status: AC

## 2021-04-07 MED ORDER — CLOPIDOGREL BISULFATE 75 MG PO TABS: 75.0000 mg | ORAL_TABLET | ORAL | Status: AC

## 2021-04-07 MED ORDER — LIDOCAINE HCL (PF) 1 % IJ SOLN
INTRAMUSCULAR | Status: AC
  Filled 2021-04-07: qty 30

## 2021-04-07 MED ORDER — HYDRALAZINE HCL 20 MG/ML IJ SOLN: 10.0000 mg | INTRAMUSCULAR | Status: DC | PRN

## 2021-04-07 MED ORDER — CEFAZOLIN SODIUM-DEXTROSE 2-3 GM-%(50ML) IV SOLR
INTRAVENOUS | Status: DC | PRN
  Administered 2021-04-07: 2 g via INTRAVENOUS

## 2021-04-07 MED ORDER — CEFAZOLIN SODIUM-DEXTROSE 2-4 GM/100ML-% IV SOLN
2.0000 g | INTRAVENOUS | Status: DC
  Filled 2021-04-07: qty 100

## 2021-04-07 MED ORDER — FENTANYL CITRATE (PF) 100 MCG/2ML IJ SOLN
INTRAMUSCULAR | Status: DC | PRN
  Administered 2021-04-07 (×3): 50 ug via INTRAVENOUS

## 2021-04-07 MED ORDER — HEPARIN SODIUM (PORCINE) 1000 UNIT/ML IJ SOLN
INTRAMUSCULAR | Status: AC
  Filled 2021-04-07: qty 10

## 2021-04-07 MED ORDER — ONDANSETRON HCL 4 MG/2ML IJ SOLN: 4.0000 mg | Freq: Four times a day (QID) | INTRAMUSCULAR | Status: DC | PRN

## 2021-04-07 MED ORDER — LABETALOL HCL 5 MG/ML IV SOLN: 10.0000 mg | INTRAVENOUS | Status: DC | PRN

## 2021-04-07 MED ORDER — LIDOCAINE HCL (PF) 1 % IJ SOLN
INTRAMUSCULAR | Status: DC | PRN
  Administered 2021-04-07: 12 mL

## 2021-04-07 SURGICAL SUPPLY — 18 items
CATH ACUNAV 8FR 90CM (CATHETERS) ×1 IMPLANT
CATH INFINITI 6F MPA2 100CM (CATHETERS) ×1 IMPLANT
CLOSURE PERCLOSE PROSTYLE (VASCULAR PRODUCTS) ×2 IMPLANT
COVER SWIFTLINK CONNECTOR (BAG) ×1 IMPLANT
ELECT DEFIB PAD ADLT CADENCE (PAD) ×1 IMPLANT
GUIDEWIRE AMPLATZER 1.5JX260 (WIRE) ×1 IMPLANT
KIT MICROPUNCTURE NIT STIFF (SHEATH) ×1 IMPLANT
OCCLUDER PFO TALISMAN 18-18 (Prosthesis & Implant Heart) IMPLANT
PACK CARDIAC CATHETERIZATION (CUSTOM PROCEDURE TRAY) ×2 IMPLANT
PROTECTION STATION PRESSURIZED (MISCELLANEOUS) ×2
SHEATH DELIVERY TALISMAN 8F 80 (SHEATH) IMPLANT
SHEATH INTROD W/O MIN 9FR 25CM (SHEATH) ×1 IMPLANT
SHEATH PINNACLE 8F 10CM (SHEATH) ×1 IMPLANT
SHEATH PROBE COVER 6X72 (BAG) ×1 IMPLANT
STATION PROTECTION PRESSURIZED (MISCELLANEOUS) IMPLANT
TALISMAN DELIVERY SHEATH 8F 80 (SHEATH) ×2
TALISMAN PFO OCCLUDER 18-18 (Prosthesis & Implant Heart) ×2 IMPLANT
WIRE EMERALD 3MM-J .035X150CM (WIRE) ×1 IMPLANT

## 2021-04-07 NOTE — Interval H&P Note (Signed)
History and Physical Interval Note:  04/07/2021 6:32 AM  Mark Mclaughlin  has presented today for surgery, with the diagnosis of pfo.  The various methods of treatment have been discussed with the patient and family. After consideration of risks, benefits and other options for treatment, the patient has consented to  Procedure(s): PATENT FORAMEN OVALE (PFO) CLOSURE (N/A) as a surgical intervention.  The patient's history has been reviewed, patient examined, no change in status, stable for surgery.  I have reviewed the patient's chart and labs.  Questions were answered to the patient's satisfaction.     Sherren Mocha

## 2021-04-07 NOTE — Progress Notes (Signed)
Pt ambulated without difficulty or bleeding.   Discharged home with his wife who will drive and stay with pt x 24 hrs. 

## 2021-04-07 NOTE — Progress Notes (Signed)
Echocardiogram 2D Echocardiogram limited s/p PFO closure has been performed.  Mark Mclaughlin 04/07/2021, 10:06 AM

## 2021-04-07 NOTE — Progress Notes (Signed)
At 12:00, RN noticed pt had slight oozing at the R groin site. Manual pressure held x15 minutes and Dr. Burt Knack notified. Pt to remain bedrest x1 additional hour. Will continue to assess R groin site Q15 minutes and will reattempt ambulation at 1315.

## 2021-04-07 NOTE — Discharge Instructions (Addendum)

## 2021-04-08 ENCOUNTER — Ambulatory Visit: Payer: BC Managed Care – PPO | Admitting: Cardiovascular Disease

## 2021-04-08 ENCOUNTER — Encounter (HOSPITAL_COMMUNITY): Payer: Self-pay | Admitting: Cardiovascular Disease

## 2021-04-08 MED FILL — Cefazolin Sodium-Dextrose IV Solution 2 GM/100ML-4%: INTRAVENOUS | Qty: 100 | Status: AC

## 2021-04-09 ENCOUNTER — Inpatient Hospital Stay: Payer: Self-pay | Admitting: Neurology

## 2021-05-05 ENCOUNTER — Ambulatory Visit: Payer: BC Managed Care – PPO

## 2021-05-07 ENCOUNTER — Other Ambulatory Visit: Payer: Self-pay | Admitting: Physician Assistant

## 2021-05-07 ENCOUNTER — Other Ambulatory Visit: Payer: Self-pay

## 2021-05-07 ENCOUNTER — Ambulatory Visit (INDEPENDENT_AMBULATORY_CARE_PROVIDER_SITE_OTHER): Payer: BC Managed Care – PPO | Admitting: Physician Assistant

## 2021-05-07 VITALS — BP 122/80 | HR 57 | Ht 70.0 in | Wt 188.0 lb

## 2021-05-07 DIAGNOSIS — I639 Cerebral infarction, unspecified: Secondary | ICD-10-CM | POA: Diagnosis not present

## 2021-05-07 DIAGNOSIS — R001 Bradycardia, unspecified: Secondary | ICD-10-CM | POA: Diagnosis not present

## 2021-05-07 DIAGNOSIS — Z8774 Personal history of (corrected) congenital malformations of heart and circulatory system: Secondary | ICD-10-CM

## 2021-05-07 DIAGNOSIS — E782 Mixed hyperlipidemia: Secondary | ICD-10-CM

## 2021-05-07 DIAGNOSIS — Q2112 Patent foramen ovale: Secondary | ICD-10-CM

## 2021-05-07 MED ORDER — AMOXICILLIN 500 MG PO TABS: ORAL_TABLET | ORAL | 6 refills | Status: DC

## 2021-05-07 NOTE — Progress Notes (Signed)
Mark Mclaughlin                                     Cardiology Office Note:    Date:  05/08/2021   ID:  Mark Mclaughlin, DOB Aug 01, 1960, MRN [id number]  PCP:  Caren Macadam, MD  [facility] Cardiologist:  Quay Burow, MD  [facility] Electrophysiologist:  None   Referring MD: Caren Macadam, MD   1 month s/p PFO closure   History of Present Illness:    Mark Mclaughlin is a 61 y.o. male with a hx of HLD, bradycardia, ischemic CVA and PFO s/p PFO closure (04/07/21) who presents to clinic for follow up.  He has a hx of ischemic stroke in November 2022 and found to have PFO on stroke evaluation. The patient presented with acute onset of left arm numbness.  He had been in his normal state of health prior to this.  When he presented to the emergency department, a code stroke was called.  He was out of the window for tPA.  An MRI of the brain showed a right frontoparietal stroke.  The patient was evaluated by neurology in the hospital.  An echocardiogram showed right to left shunting and was otherwise normal.  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  He returned for outpatient transesophageal echo which showed normal LV and RV function, no significant valvular disease, and a large PFO with right to left shunting. An event monitor did not show any atrial fibrillation.   He was evaluated by Dr. Burt Knack and felt to have an elevated ROPE score and a good candidate for transcatheter PFO closure. He underwent successful transcatheter PFO closure using an Amplatzer Talisman 18 mm PFO Occluder device on 04/07/21. Post op limited echo showed no residual atrial level shunting.   Today the patient presents to clinic for follow up. No CP or SOB. No LE edema, orthopnea or PND. No dizziness or syncope. No blood in stool or urine. No palpitations.     Past Medical History:  Diagnosis Date    Hyperlipidemia     Past Surgical History:  Procedure Laterality Date   ANTERIOR CRUCIATE LIGAMENT REPAIR     BUBBLE STUDY  02/14/2021   Procedure: BUBBLE STUDY;  Surgeon: Freada Bergeron, MD;  Location: [facility] ENDOSCOPY;  Service: Cardiovascular;;   PATENT FORAMEN OVALE(PFO) CLOSURE N/A 04/07/2021   Procedure: PATENT FORAMEN OVALE (PFO) CLOSURE;  Surgeon: Sherren Mocha, MD;  Location: [place] CV LAB;  Service: Cardiovascular;  Laterality: N/A;   TEE WITHOUT CARDIOVERSION N/A 02/14/2021   Procedure: TRANSESOPHAGEAL ECHOCARDIOGRAM (TEE);  Surgeon: Freada Bergeron, MD;  Location: [facility] ENDOSCOPY;  Service: Cardiovascular;  Laterality: N/A;    Current Medications: Current Meds  Medication Sig   amoxicillin (AMOXIL) 500 MG tablet Take 4 tablets by mouth 1 hour before dental procedures and cleanings   aspirin EC 81 MG EC tablet Take 1 tablet (81 mg total) by mouth daily. Swallow whole.   clopidogrel (PLAVIX) 75 MG tablet Take 1 tablet (75 mg total) by mouth daily.   Multiple Vitamin (MULTIVITAMIN) tablet Take 1 tablet by mouth daily with supper.   pravastatin (PRAVACHOL) 40 MG tablet Take 40 mg by mouth daily.     Allergies:   Lipitor [atorvastatin]   Social History   Socioeconomic History   Marital status: Married  Spouse name: Not on file   Number of children: Not on file   Years of education: Not on file   Highest education level: Not on file  Occupational History   Not on file  Tobacco Use   Smoking status: Never   Smokeless tobacco: Not on file  Substance and Sexual Activity   Alcohol use: No   Drug use: No   Sexual activity: Not on file  Other Topics Concern   Not on file  Social History Narrative   Not on file   Social Determinants of Health   Financial Resource Strain: Low Risk    Difficulty of Paying Living Expenses: Not hard at all  Food Insecurity: No Food Insecurity   Worried About [place] in the Last Year: Never true   [place]  in the Last Year: Never true  Transportation Needs: No Transportation Needs   Lack of Transportation (Medical): No   Lack of Transportation (Non-Medical): No  Physical Activity: Not on file  Stress: No Stress Concern Present   Feeling of Stress : Not at all  Social Connections: Not on file     Family History: The patient's family history includes Cancer in his father; Heart attack in his maternal grandfather and paternal uncle.  ROS:   Please see the history of present illness.    All other systems reviewed and are negative.  EKGs/Labs/Other Studies Reviewed:    The following studies were reviewed today:  04/07/21 PATENT FORAMEN OVALE (PFO) CLOSURE   Conclusion  Successful transcatheter PFO closure using an Amplatzer Talisman 18 mm PFO Occluder device   Recommend: ASA and clopidogrel x 6 months SBE prophylaxis x 6 months Limited 2D echo prior to DC home today   ____________________   04/07/21 Limited echo IMPRESSIONS  1. Amplatzer Talisman 18 mm PFO Occluder device placed on 04/07/2021. No  residual shunt by color flow Doppler.   2. Trivial mitral valve regurgitation.     EKG:  EKG is NOT ordered today.  Recent Labs: 01/21/2021: Magnesium 1.9; TSH 1.190 02/13/2021: ALT 24 04/02/2021: BUN 18; Creatinine, Ser 1.20; Hemoglobin 16.0; Platelets 275; Potassium 4.5; Sodium 140  Recent Lipid Panel    Component Value Date/Time   CHOL 156 02/13/2021 1711   TRIG 175 (H) 02/13/2021 1711   HDL 57 02/13/2021 1711   CHOLHDL 2.7 02/13/2021 1711   CHOLHDL 3.1 01/21/2021 1221   VLDL 12 01/21/2021 1221   LDLCALC 70 02/13/2021 1711     Risk Assessment/Calculations:       Physical Exam:    VS:  BP 122/80    Pulse (!) 57    Ht '5\' 10"'$  (1.778 m)    Wt 188 lb (85.3 kg)    SpO2 96%    BMI 26.98 kg/m     Wt Readings from Last 3 Encounters:  05/07/21 188 lb (85.3 kg)  04/07/21 183 lb (83 kg)  03/13/21 186 lb 12.8 oz (84.7 kg)     GEN:  Well nourished, well developed in no  acute distress HEENT: Normal NECK: No JVD LYMPHATICS: No lymphadenopathy CARDIAC: RRR, no murmurs, rubs, gallops RESPIRATORY:  Clear to auscultation without rales, wheezing or rhonchi  ABDOMEN: Soft, non-tender, non-distended MUSCULOSKELETAL:  No edema; No deformity  SKIN: Warm and dry NEUROLOGIC:  Alert and oriented x 3 PSYCHIATRIC:  Normal affect   ASSESSMENT:    1. S/P percutaneous patent foramen ovale closure   2. Mixed hyperlipidemia   3. Bradycardia  4. Cerebrovascular accident (CVA), unspecified mechanism ([place])    PLAN:    In order of problems listed above:  S/p PFO closure: doing well 1 month out from pfo closure. Groin site healing well. SBE prophylaxis discussed; I have RX'd amoxicillin. Continue on aspirin and plavix x 6 months followed by aspirin alone. I will see him back in 1 year with echo with bubble.   HLD: continue on pravastatin '40mg'$  daily. Did not tolerate the '80mg'$  daily due to myalgias.   Bradycardia: asymptomatic and felt to be due to high vagal tone ( long distance runner).  Hx of CVA: felt to be PFO related and now s/p closure. Continue on aspirin and statin.   Medication Adjustments/Labs and Tests Ordered: Current medicines are reviewed at length with the patient today.  Concerns regarding medicines are outlined above.  No orders of the defined types were placed in this encounter.  Meds ordered this encounter  Medications   amoxicillin (AMOXIL) 500 MG tablet    Sig: Take 4 tablets by mouth 1 hour before dental procedures and cleanings    Dispense:  12 tablet    Refill:  6    Patient Instructions  Medication Instructions:  1) Start Amoxicillin 500 mg , take 4 tablets by mouth 1 hour before dental procedures and cleanings   2) You can stop Plavix after 6 months   *If you need a refill on your cardiac medications before your next appointment, please call your pharmacy*   Lab Work: None  If you have labs (blood work) drawn today and your  tests are completely normal, you will receive your results only by: MyChart Message (if you have MyChart) OR A paper copy in the mail If you have any lab test that is abnormal or we need to change your treatment, we will call you to review the results.   Testing/Procedures: None ordered today    Follow-Up: Follow up as scheduled     Other Instructions None     Signed, Angelena Form, PA-C  05/08/2021 10:32 AM    [facility]

## 2021-05-07 NOTE — Patient Instructions (Signed)
Medication Instructions:  ?1) Start Amoxicillin 500 mg , take 4 tablets by mouth 1 hour before dental procedures and cleanings  ? ?2) You can stop Plavix after 6 months  ? ?*If you need a refill on your cardiac medications before your next appointment, please call your pharmacy* ? ? ?Lab Work: ?None  ?If you have labs (blood work) drawn today and your tests are completely normal, you will receive your results only by: ?MyChart Message (if you have MyChart) OR ?A paper copy in the mail ?If you have any lab test that is abnormal or we need to change your treatment, we will call you to review the results. ? ? ?Testing/Procedures: ?None ordered today  ? ? ?Follow-Up: ?Follow up as scheduled   ? ? ?Other Instructions ?None   ?

## 2021-05-27 DIAGNOSIS — D2271 Melanocytic nevi of right lower limb, including hip: Secondary | ICD-10-CM | POA: Diagnosis not present

## 2021-05-27 DIAGNOSIS — D225 Melanocytic nevi of trunk: Secondary | ICD-10-CM | POA: Diagnosis not present

## 2021-05-27 DIAGNOSIS — L578 Other skin changes due to chronic exposure to nonionizing radiation: Secondary | ICD-10-CM | POA: Diagnosis not present

## 2021-05-27 DIAGNOSIS — L814 Other melanin hyperpigmentation: Secondary | ICD-10-CM | POA: Diagnosis not present

## 2021-06-07 DIAGNOSIS — T148XXA Other injury of unspecified body region, initial encounter: Secondary | ICD-10-CM | POA: Diagnosis not present

## 2021-07-01 ENCOUNTER — Encounter: Payer: Self-pay | Admitting: Neurology

## 2021-07-01 ENCOUNTER — Ambulatory Visit (INDEPENDENT_AMBULATORY_CARE_PROVIDER_SITE_OTHER): Payer: BC Managed Care – PPO | Admitting: Neurology

## 2021-07-01 VITALS — BP 123/75 | HR 58 | Ht 70.0 in | Wt 189.0 lb

## 2021-07-01 DIAGNOSIS — R200 Anesthesia of skin: Secondary | ICD-10-CM | POA: Diagnosis not present

## 2021-07-01 DIAGNOSIS — I639 Cerebral infarction, unspecified: Secondary | ICD-10-CM | POA: Diagnosis not present

## 2021-07-01 DIAGNOSIS — Q2112 Patent foramen ovale: Secondary | ICD-10-CM

## 2021-07-01 NOTE — Progress Notes (Signed)
?[facility] ?[address] ?[place]. [place] [id number] ?[phone] ? ?     OFFICE FOLLOW-UP NOTE ? ?Mr. Mark Mclaughlin ?Date of Birth:  02-22-1961 ?Medical Record Number:  [id number]  ? ?HPI: Mr. Mark Mclaughlin is a 61 year old Caucasian male seen today for initial office follow-up visit following hospital admission for stroke in November 2022.  History is obtained from the patient review of electronic medical records and I reviewed personally pertinent available films in PACS.  He has no significant past medical history except hyperlipidemia.  He presented with sudden onset of left arm weakness and numbness to [facility] droppage on 01/21/2021.  He was seen by telemetry neurologist and not administered TNK due to very mild symptoms and low NIH stroke scale.  MRI scan of the brain showed a right frontoparietal perirolandic area subcortical white matter infarct with some extension into the cortex.  MR angiogram of the brain showed no large vessel stenosis or occlusion.  MRA of the neck showed no significant extracranial stenosis.  2D echo showed ejection fraction of 55 to 60% with mild left ventricular hypertrophy.  LDL cholesterol was elevated at 127 mg percent and hemoglobin A1c was 5.3.  Patient was started on dual antiplatelet therapy for 3 weeks followed by aspirin alone.  He had a transcranial Doppler bubble study done which was strongly positive and negative for large right-to-left shunt.  He had a TEE on 02/14/2021 which confirmed this.  Lower extremity venous Dopplers were negative for DVT.  Patient was referred to Dr. Burt Knack structural heart cardiologist and after discussion risk-benefit patient underwent elective endovascular PFO closure on 04/07/2020 uneventfully.  Patient was back on dual antiplatelet therapy and recently has stopped Plavix and is now on aspirin alone.  He states he is doing well without recurrent stroke or TIA symptoms.  He still has only occasional minor paresthesias in the  left hand which have persisted.  He is tolerating aspirin well without bruising or bleeding and Pravachol without muscle aches and pains.  He is not sure when he had his last lipid profile checked.  He has no new complaints today. ? ?ROS:   ?14 system review of systems is positive for numbness, weakness all other systems negative ? ?PMH:  ?Past Medical History:  ?Diagnosis Date  ? Hyperlipidemia   ? ? ?Social History:  ?Social History  ? ?Socioeconomic History  ? Marital status: Married  ?  Spouse name: Not on file  ? Number of children: Not on file  ? Years of education: Not on file  ? Highest education level: Not on file  ?Occupational History  ? Not on file  ?Tobacco Use  ? Smoking status: Never  ? Smokeless tobacco: Not on file  ?Substance and Sexual Activity  ? Alcohol use: No  ? Drug use: No  ? Sexual activity: Not on file  ?Other Topics Concern  ? Not on file  ?Social History Narrative  ? Not on file  ? ?Social Determinants of Health  ? ?Financial Resource Strain: Low Risk   ? Difficulty of Paying Living Expenses: Not hard at all  ?Food Insecurity: No Food Insecurity  ? Worried About [place] fund[place] in the Last Year: Never true  ? Ran Out of Food in the Last Year: Never true  ?Transportation Needs: No Transportation Needs  ? Lack of Transportation (Medical): No  ? Lack of Transportation (Non-Medical): No  ?Physical Activity: Not on file  ?Stress: No Stress Concern Present  ? Feeling of Stress :  Not at all  ?Social Connections: Not on file  ?Intimate Partner Violence: Not At Risk  ? Fear of Current or Ex-Partner: No  ? Emotionally Abused: No  ? Physically Abused: No  ? Sexually Abused: No  ? ? ?Medications:   ?Current Outpatient Medications on File Prior to Visit  ?Medication Sig Dispense Refill  ? amoxicillin (AMOXIL) 500 MG tablet Take 4 tablets by mouth 1 hour before dental procedures and cleanings 12 tablet 6  ? aspirin EC 81 MG EC tablet Take 1 tablet (81 mg total) by mouth daily. Swallow whole. 30  tablet 11  ? Multiple Vitamin (MULTIVITAMIN) tablet Take 1 tablet by mouth daily with supper.    ? pravastatin (PRAVACHOL) 40 MG tablet Take 40 mg by mouth daily.    ? ?No current facility-administered medications on file prior to visit.  ? ? ?Allergies:   ?Allergies  ?Allergen Reactions  ? Lipitor [Atorvastatin] Other (See Comments)  ?  Muscle aches  ? ? ?Physical Exam ?General: well developed, well nourished, middle-aged Caucasian male seated, in no evident distress ?Head: head normocephalic and atraumatic.  ?Neck: supple with no carotid or supraclavicular bruits ?Cardiovascular: regular rate and rhythm, no murmurs ?Musculoskeletal: no deformity ?Skin:  no rash/petichiae ?Vascular:  Normal pulses all extremities ?Vitals:  ? 07/01/21 1532  ?BP: 123/75  ?Pulse: (!) 58  ? ?Neurologic Exam ?Mental Status: Awake and fully alert. Oriented to place and time. Recent and remote memory intact. Attention span, concentration and fund of knowledge appropriate. Mood and affect appropriate.  ?Cranial Nerves: Fundoscopic exam reveals sharp disc margins. Pupils equal, briskly reactive to light. Extraocular movements full without nystagmus. Visual fields full to confrontation. Hearing intact. Facial sensation intact. Face, tongue, palate moves normally and symmetrically.  ?Motor: Normal bulk and tone. Normal strength in all tested extremity muscles. ?Sensory.: intact to touch ,pinprick .position and vibratory sensation.  ?Coordination: Rapid alternating movements normal in all extremities. Finger-to-nose and heel-to-shin performed accurately bilaterally. ?Gait and Station: Arises from chair without difficulty. Stance is normal. Gait demonstrates normal stride length and balance . Able to heel, toe and tandem walk without difficulty.  ?Reflexes: 1+ and symmetric. Toes downgoing.  ? ?NIHSS  0 ?Modified Rankin  1 ? ? ?ASSESSMENT: 61 year old Caucasian male with right parietal stroke in November 2022 of cryptogenic etiology with  vascular risk factors of hyperlipidemia and PFO s/p endovascular PFO closure in February 2023 who is doing very well with only minor residual left hand paresthesias. ? ? ? ? ?PLAN:I had a long d/w patient about his recent stroke, PFO closure,risk for recurrent stroke/TIAs, personally independently reviewed imaging studies and stroke evaluation results and answered questions.Continue aspirin 81 mg daily  for secondary stroke prevention and maintain strict control of hypertension with blood pressure goal below 130/90, diabetes with hemoglobin A1c goal below 6.5% and lipids with LDL cholesterol goal below 70 mg/dL. I also advised the patient to eat a healthy diet with plenty of whole grains, cereals, fruits and vegetables, exercise regularly and maintain ideal body weight Followup in the future with me in 1 year or call earlier if needed.Greater than 50% of time during this 35 minute visit was spent on counseling,explanation of diagnosis, planning of further management, discussion with patient and family and coordination of care ?Antony Contras, MD ?Note: This document was prepared with digital dictation and possible smart phrase technology. Any transcriptional errors that result from this process are unintentional ? ?

## 2021-07-01 NOTE — Patient Instructions (Signed)
I had a long d/w patient about his recent stroke, risk for recurrent stroke/TIAs, personally independently reviewed imaging studies and stroke evaluation results and answered questions.Continue aspirin 81 mg daily  for secondary stroke prevention and maintain strict control of hypertension with blood pressure goal below 130/90, diabetes with hemoglobin A1c goal below 6.5% and lipids with LDL cholesterol goal below 70 mg/dL. I also advised the patient to eat a healthy diet with plenty of whole grains, cereals, fruits and vegetables, exercise regularly and maintain ideal body weight Followup in the future with me in 1 year or call earlier if needed. ?Stroke Prevention ?Some medical conditions and behaviors can lead to a higher chance of having a stroke. You can help prevent a stroke by eating healthy, exercising, not smoking, and managing any medical conditions you have. ?Stroke is a leading cause of functional impairment. Primary prevention is particularly important because a majority of strokes are first-time events. Stroke changes the lives of not only those who experience a stroke but also their family and other caregivers. ?How can this condition affect me? ?A stroke is a medical emergency and should be treated right away. A stroke can lead to brain damage and can sometimes be life-threatening. If a person gets medical treatment right away, there is a better chance of surviving and recovering from a stroke. ?What can increase my risk? ?The following medical conditions may increase your risk of a stroke: ?Cardiovascular disease. ?High blood pressure (hypertension). ?Diabetes. ?High cholesterol. ?Sickle cell disease. ?Blood clotting disorders (hypercoagulable state). ?Obesity. ?Sleep disorders (obstructive sleep apnea). ?Other risk factors include: ?Being older than age 20. ?Having a history of blood clots, stroke, or mini-stroke (transient ischemic attack, TIA). ?Genetic factors, such as race, ethnicity, or a  family history of stroke. ?Smoking cigarettes or using other tobacco products. ?Taking birth control pills, especially if you also use tobacco. ?Heavy use of alcohol or drugs, especially cocaine and methamphetamine. ?Physical inactivity. ?What actions can I take to prevent this? ?Manage your health conditions ?High cholesterol levels. ?Eating a healthy diet is important for preventing high cholesterol. If cholesterol cannot be managed through diet alone, you may need to take medicines. ?Take any prescribed medicines to control your cholesterol as told by your health care provider. ?Hypertension. ?To reduce your risk of stroke, try to keep your blood pressure below 130/80. ?Eating a healthy diet and exercising regularly are important for controlling blood pressure. If these steps are not enough to manage your blood pressure, you may need to take medicines. ?Take any prescribed medicines to control hypertension as told by your health care provider. ?Ask your health care provider if you should monitor your blood pressure at home. ?Have your blood pressure checked every year, even if your blood pressure is normal. Blood pressure increases with age and some medical conditions. ?Diabetes. ?Eating a healthy diet and exercising regularly are important parts of managing your blood sugar (glucose). If your blood sugar cannot be managed through diet and exercise, you may need to take medicines. ?Take any prescribed medicines to control your diabetes as told by your health care provider. ?Get evaluated for obstructive sleep apnea. Talk to your health care provider about getting a sleep evaluation if you snore a lot or have excessive sleepiness. ?Make sure that any other medical conditions you have, such as atrial fibrillation or atherosclerosis, are managed. ?Nutrition ?Follow instructions from your health care provider about what to eat or drink to help manage your health condition. These instructions may include: ?Reducing  your daily calorie intake. ?Limiting how much salt (sodium) you use to 1,500 milligrams (mg) each day. ?Using only healthy fats for cooking, such as olive oil, canola oil, or sunflower oil. ?Eating healthy foods. You can do this by: ?Choosing foods that are high in fiber, such as whole grains, and fresh fruits and vegetables. ?Eating at least 5 servings of fruits and vegetables a day. Try to fill one-half of your plate with fruits and vegetables at each meal. ?Choosing lean protein foods, such as lean cuts of meat, poultry without skin, fish, tofu, beans, and nuts. ?Eating low-fat dairy products. ?Avoiding foods that are high in sodium. This can help lower blood pressure. ?Avoiding foods that have saturated fat, trans fat, and cholesterol. This can help prevent high cholesterol. ?Avoiding processed and prepared foods. ?Counting your daily carbohydrate intake. ? ?Lifestyle ?If you drink alcohol: ?Limit how much you have to: ?0-1 drink a day for women who are not pregnant. ?0-2 drinks a day for men. ?Know how much alcohol is in your drink. In the [place] one drink equals one 12 oz bottle of beer (336m), one 5 oz glass of wine (1450m, or one 1? oz glass of hard liquor (4412m ?Do not use any products that contain nicotine or tobacco. These products include cigarettes, chewing tobacco, and vaping devices, such as e-cigarettes. If you need help quitting, ask your health care provider. ?Avoid secondhand smoke. ?Do not use drugs. ?Activity ? ?Try to stay at a healthy weight. ?Get at least 30 minutes of exercise on most days, such as: ?Fast walking. ?Biking. ?Swimming. ?Medicines ?Take over-the-counter and prescription medicines only as told by your health care provider. Aspirin or blood thinners (antiplatelets or anticoagulants) may be recommended to reduce your risk of forming blood clots that can lead to stroke. ?Avoid taking birth control pills. Talk to your health care provider about the risks of taking birth control  pills if: ?You are over 35 46ars old. ?You smoke. ?You get very bad headaches. ?You have had a blood clot. ?Where to find more information ?American Stroke Association: www.strokeassociation.org ?Get help right away if: ?You or a loved one has any symptoms of a stroke. "BE FAST" is an easy way to remember the main warning signs of a stroke: ?B - Balance. Signs are dizziness, sudden trouble walking, or loss of balance. ?E - Eyes. Signs are trouble seeing or a sudden change in vision. ?F - Face. Signs are sudden weakness or numbness of the face, or the face or eyelid drooping on one side. ?A - Arms. Signs are weakness or numbness in an arm. This happens suddenly and usually on one side of the body. ?S - Speech. Signs are sudden trouble speaking, slurred speech, or trouble understanding what people say. ?T - Time. Time to call emergency services. Write down what time symptoms started. ?You or a loved one has other signs of a stroke, such as: ?A sudden, severe headache with no known cause. ?Nausea or vomiting. ?Seizure. ?These symptoms may represent a serious problem that is an emergency. Do not wait to see if the symptoms will go away. Get medical help right away. Call your local emergency services (911 in the [place] Do not drive yourself to the hospital. ?Summary ?You can help to prevent a stroke by eating healthy, exercising, not smoking, limiting alcohol intake, and managing any medical conditions you may have. ?Do not use any products that contain nicotine or tobacco. These include cigarettes, chewing tobacco, and vaping devices, such as  e-cigarettes. If you need help quitting, ask your health care provider. ?Remember "BE FAST" for warning signs of a stroke. Get help right away if you or a loved one has any of these signs. ?This information is not intended to replace advice given to you by your health care provider. Make sure you discuss any questions you have with your health care provider. ?Document Revised:  09/18/2019 Document Reviewed: 09/18/2019 ?Elsevier Patient Education ? [place]. ? ? ?

## 2021-07-09 ENCOUNTER — Telehealth: Payer: Self-pay | Admitting: Neurology

## 2021-07-09 NOTE — Telephone Encounter (Signed)
[employer] no Roxanna Mew ref # [id number], sent Butch Penny a message she will reach out to the patient to schedule.  ?

## 2021-08-01 ENCOUNTER — Ambulatory Visit (HOSPITAL_COMMUNITY): Payer: BC Managed Care – PPO

## 2021-08-01 ENCOUNTER — Other Ambulatory Visit (HOSPITAL_COMMUNITY): Payer: BC Managed Care – PPO

## 2021-08-01 ENCOUNTER — Encounter (HOSPITAL_COMMUNITY): Payer: Self-pay

## 2021-08-01 ENCOUNTER — Ambulatory Visit (HOSPITAL_COMMUNITY)
Admission: RE | Admit: 2021-08-01 | Discharge: 2021-08-01 | Disposition: A | Discharge status: Home / Self Care | Payer: BC Managed Care – PPO | Source: Ambulatory Visit | Attending: Neurology | Admitting: Neurology

## 2021-08-01 DIAGNOSIS — Q2112 Patent foramen ovale: Secondary | ICD-10-CM

## 2021-09-23 ENCOUNTER — Encounter: Payer: Self-pay | Admitting: Cardiovascular Disease

## 2021-09-26 DIAGNOSIS — D123 Benign neoplasm of transverse colon: Secondary | ICD-10-CM | POA: Diagnosis not present

## 2021-09-26 DIAGNOSIS — Z09 Encounter for follow-up examination after completed treatment for conditions other than malignant neoplasm: Secondary | ICD-10-CM | POA: Diagnosis not present

## 2021-09-26 DIAGNOSIS — K2940 Chronic atrophic gastritis without bleeding: Secondary | ICD-10-CM | POA: Diagnosis not present

## 2021-09-26 DIAGNOSIS — K648 Other hemorrhoids: Secondary | ICD-10-CM | POA: Diagnosis not present

## 2021-09-26 DIAGNOSIS — R1310 Dysphagia, unspecified: Secondary | ICD-10-CM | POA: Diagnosis not present

## 2021-09-26 DIAGNOSIS — K31A19 Gastric intestinal metaplasia without dysplasia, unspecified site: Secondary | ICD-10-CM | POA: Diagnosis not present

## 2021-09-26 DIAGNOSIS — Z86010 Personal history of colonic polyps: Secondary | ICD-10-CM | POA: Diagnosis not present

## 2021-09-26 DIAGNOSIS — K219 Gastro-esophageal reflux disease without esophagitis: Secondary | ICD-10-CM | POA: Diagnosis not present

## 2021-09-26 DIAGNOSIS — K5730 Diverticulosis of large intestine without perforation or abscess without bleeding: Secondary | ICD-10-CM | POA: Diagnosis not present

## 2021-11-10 DIAGNOSIS — L821 Other seborrheic keratosis: Secondary | ICD-10-CM | POA: Diagnosis not present

## 2021-11-10 DIAGNOSIS — D225 Melanocytic nevi of trunk: Secondary | ICD-10-CM | POA: Diagnosis not present

## 2021-11-10 DIAGNOSIS — D485 Neoplasm of uncertain behavior of skin: Secondary | ICD-10-CM | POA: Diagnosis not present

## 2021-12-10 DIAGNOSIS — L814 Other melanin hyperpigmentation: Secondary | ICD-10-CM | POA: Diagnosis not present

## 2021-12-26 DIAGNOSIS — Z125 Encounter for screening for malignant neoplasm of prostate: Secondary | ICD-10-CM | POA: Diagnosis not present

## 2021-12-26 DIAGNOSIS — E782 Mixed hyperlipidemia: Secondary | ICD-10-CM | POA: Diagnosis not present

## 2021-12-26 DIAGNOSIS — Z0000 Encounter for general adult medical examination without abnormal findings: Secondary | ICD-10-CM | POA: Diagnosis not present

## 2021-12-26 DIAGNOSIS — Z23 Encounter for immunization: Secondary | ICD-10-CM | POA: Diagnosis not present

## 2022-01-28 DIAGNOSIS — R1310 Dysphagia, unspecified: Secondary | ICD-10-CM | POA: Diagnosis not present

## 2022-01-28 DIAGNOSIS — Z86010 Personal history of colonic polyps: Secondary | ICD-10-CM | POA: Diagnosis not present

## 2022-01-28 DIAGNOSIS — K219 Gastro-esophageal reflux disease without esophagitis: Secondary | ICD-10-CM | POA: Diagnosis not present

## 2022-01-28 DIAGNOSIS — K31A0 Gastric intestinal metaplasia, unspecified: Secondary | ICD-10-CM | POA: Diagnosis not present

## 2022-03-29 ENCOUNTER — Encounter (HOSPITAL_COMMUNITY): Payer: Self-pay

## 2022-03-30 NOTE — Telephone Encounter (Signed)
Will forward to CVD triage pool

## 2022-04-22 ENCOUNTER — Ambulatory Visit: Payer: BC Managed Care – PPO

## 2022-04-22 ENCOUNTER — Other Ambulatory Visit (HOSPITAL_COMMUNITY): Payer: BC Managed Care – PPO

## 2022-04-24 NOTE — Progress Notes (Unsigned)
[facility]                                     Cardiology Office Note:    Date:  04/27/2022   ID:  Mark Mclaughlin, DOB [birth year], MRN [id number]  PCP:  Caren Macadam, MD  [facility] Cardiologist:  Quay Burow, MD / Dr. Burt Knack, MD (PFO) [facility] Electrophysiologist:  None   Referring MD: Caren Macadam, MD   Chief Complaint  Patient presents with   Follow-up    1 year s/p PFO   History of Present Illness:    Mark Mclaughlin is a 62 y.o. male with a hx of HLD, bradycardia, ischemic CVA and PFO s/p PFO closure (04/07/21) who presents to clinic for one year follow up.   He has a hx of ischemic stroke in 12/2020 and found to have PFO on stroke evaluation. The patient presented with acute onset of left arm numbness.  He had been in his normal state of health prior to this.  When he presented to the emergency department, a code stroke was called.  He was out of the window for tPA.  An MRI of the brain showed a right frontoparietal stroke. The patient was evaluated by neurology in the hospital.  An echocardiogram showed right to left shunting and was otherwise normal.  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 He returned for outpatient transesophageal echo which showed normal LV and RV function, no significant valvular disease, and a large PFO with right to left shunting. An event monitor did not show any atrial fibrillation.    He was evaluated by Dr. Burt Knack and felt to have an elevated ROPE score and a good candidate for transcatheter PFO closure. He underwent successful transcatheter PFO closure using an Amplatzer Talisman 18 mm PFO Occluder device on 04/07/21. Post op limited echo showed no residual atrial level shunting.    Today the patient presents to clinic for follow up. No CP or SOB. No LE edema, orthopnea or PND. No dizziness or syncope. No blood in stool or urine. No  palpitations.    Past Surgical History:  Procedure Laterality Date   ANTERIOR CRUCIATE LIGAMENT REPAIR     BUBBLE STUDY  02/14/2021   Procedure: BUBBLE STUDY;  Surgeon: Freada Bergeron, MD;  Location: [facility] ENDOSCOPY;  Service: Cardiovascular;;   PATENT FORAMEN OVALE(PFO) CLOSURE N/A 04/07/2021   Procedure: PATENT FORAMEN OVALE (PFO) CLOSURE;  Surgeon: Sherren Mocha, MD;  Location: [place] CV LAB;  Service: Cardiovascular;  Laterality: N/A;   TEE WITHOUT CARDIOVERSION N/A 02/14/2021   Procedure: TRANSESOPHAGEAL ECHOCARDIOGRAM (TEE);  Surgeon: Freada Bergeron, MD;  Location: [facility] ENDOSCOPY;  Service: Cardiovascular;  Laterality: N/A;   Current Medications: Current Meds  Medication Sig   aspirin EC 81 MG EC tablet Take 1 tablet (81 mg total) by mouth daily. Swallow whole.   Cholecalciferol (VITAMIN D-3 PO) Take 1 tablet by mouth daily.   Multiple Vitamin (MULTIVITAMIN) tablet Take 1 tablet by mouth daily with supper.   pravastatin (PRAVACHOL) 40 MG tablet Take 40 mg by mouth daily.   Vitamins A D K (ADK PO) Take 1 tablet by mouth daily.   [DISCONTINUED] amoxicillin (AMOXIL) 500 MG tablet Take 4 tablets by mouth 1 hour before dental procedures and cleanings     Allergies:   Lipitor [atorvastatin]  Social History   Socioeconomic History   Marital status: Married    Spouse name: Not on file   Number of children: Not on file   Years of education: Not on file   Highest education level: Not on file  Occupational History   Not on file  Tobacco Use   Smoking status: Never   Smokeless tobacco: Not on file  Substance and Sexual Activity   Alcohol use: No   Drug use: No   Sexual activity: Not on file  Other Topics Concern   Not on file  Social History Narrative   Not on file   Social Determinants of Health   Financial Resource Strain: Low Risk  (02/03/2021)   Overall Financial Resource Strain (CARDIA)    Difficulty of Paying Living Expenses: Not hard at all  Food  Insecurity: No Food Insecurity (02/03/2021)   Hunger Vital Sign    Worried About Running Out of Food in the Last Year: Never true    [place] in the Last Year: Never true  Transportation Needs: No Transportation Needs (02/03/2021)   PRAPARE - Hydrologist (Medical): No    Lack of Transportation (Non-Medical): No  Physical Activity: Not on file  Stress: No Stress Concern Present (02/03/2021)   [place]    Feeling of Stress : Not at all  Social Connections: Not on file     Family History: The patient's family history includes Cancer in his father; Heart attack in his maternal grandfather and paternal uncle.  ROS:   Please see the history of present illness.    All other systems reviewed and are negative.  EKGs/Labs/Other Studies Reviewed:    The following studies were reviewed today:  04/07/21 PATENT FORAMEN OVALE (PFO) CLOSURE    Conclusion   Successful transcatheter PFO closure using an Amplatzer Talisman 18 mm PFO Occluder device   Recommend: ASA and clopidogrel x 6 months SBE prophylaxis x 6 months Limited 2D echo prior to DC home today   ____________________     04/07/21 Limited echo IMPRESSIONS  1. Amplatzer Talisman 18 mm PFO Occluder device placed on 04/07/2021. No  residual shunt by color flow Doppler.   2. Trivial mitral valve regurgitation.   EKG:  EKG is not ordered today.    Recent Labs: No results found for requested labs within last 365 days.   Recent Lipid Panel    Component Value Date/Time   CHOL 156 02/13/2021 1711   TRIG 175 (H) 02/13/2021 1711   HDL 57 02/13/2021 1711   CHOLHDL 2.7 02/13/2021 1711   CHOLHDL 3.1 01/21/2021 1221   VLDL 12 01/21/2021 1221   LDLCALC 70 02/13/2021 1711   Physical Exam:    VS:  BP 102/72   Pulse (!) 57   Ht '5\' 10"'$  (1.778 m)   Wt 191 lb 12.8 oz (87 kg)   SpO2 95%   BMI 27.52 kg/m     Wt Readings from Last  3 Encounters:  04/27/22 191 lb 12.8 oz (87 kg)  07/01/21 189 lb (85.7 kg)  05/07/21 188 lb (85.3 kg)    General: Well developed, well nourished, NAD Lungs:Clear to ausculation bilaterally. No wheezes, rales, or rhonchi. Breathing is unlabored. Cardiovascular: RRR with S1 S2. No murmurs Extremities: No edema.  Neuro: Alert and oriented. No focal deficits. No facial asymmetry. MAE spontaneously. Psych: Responds to questions appropriately with normal affect.    ASSESSMENT/PLAN:  S/p PFO closure: Patient doing great 1 year s/p PFO closure. Echo with bubble today with no evidence of shunting however awaiting final read. No longer will require dental SBE prophylaxis. Continue ASA. Plan PRN follow up.    HLD: Continue on pravastatin '40mg'$  daily.     Hx of CVA: Felt to be PFO related and now s/p closure. Continue on ASA and statin.   Past Medical History:  Diagnosis Date   Hyperlipidemia    Medication Adjustments/Labs and Tests Ordered: Current medicines are reviewed at length with the patient today.  Concerns regarding medicines are outlined above.  No orders of the defined types were placed in this encounter.  No orders of the defined types were placed in this encounter.   Patient Instructions  Medication Instructions:  Your physician has recommended you make the following change in your medication:  STOP AMOXICILLIN  *If you need a refill on your cardiac medications before your next appointment, please call your pharmacy*   Lab Work: NONE If you have labs (blood work) drawn today and your tests are completely normal, you will receive your results only by: [place] (if you have MyChart) OR A paper copy in the mail If you have any lab test that is abnormal or we need to change your treatment, we will call you to review the results.   Testing/Procedures: NONE   Follow-Up: At [facility], you and your health needs are our priority.  As part of our  continuing mission to provide you with exceptional heart care, we have created designated Provider Care Teams.  These Care Teams include your primary Cardiologist (physician) and Advanced Practice Providers (APPs -  Physician Assistants and Nurse Practitioners) who all work together to provide you with the care you need, when you need it.  We recommend signing up for the patient portal called "MyChart".  Sign up information is provided on this After Visit Summary.  MyChart is used to connect with patients for Virtual Visits (Telemedicine).  Patients are able to view lab/test results, encounter notes, upcoming appointments, etc.  Non-urgent messages can be sent to your provider as well.   To learn more about what you can do with MyChart, go to [email or web address].    Your next appointment:   AS NEEDED WITH STRUCTURAL HEART      Signed, Kathyrn Drown, NP  04/27/2022 9:40 AM    [place]

## 2022-04-27 ENCOUNTER — Ambulatory Visit (INDEPENDENT_AMBULATORY_CARE_PROVIDER_SITE_OTHER): Payer: BC Managed Care – PPO | Admitting: Cardiology

## 2022-04-27 ENCOUNTER — Ambulatory Visit: Payer: BC Managed Care – PPO | Attending: Physician Assistant

## 2022-04-27 VITALS — BP 102/72 | HR 57 | Ht 70.0 in | Wt 191.8 lb

## 2022-04-27 DIAGNOSIS — I639 Cerebral infarction, unspecified: Secondary | ICD-10-CM

## 2022-04-27 DIAGNOSIS — Q2112 Patent foramen ovale: Secondary | ICD-10-CM | POA: Insufficient documentation

## 2022-04-27 DIAGNOSIS — Z8774 Personal history of (corrected) congenital malformations of heart and circulatory system: Secondary | ICD-10-CM | POA: Diagnosis not present

## 2022-04-27 DIAGNOSIS — I10 Essential (primary) hypertension: Secondary | ICD-10-CM | POA: Insufficient documentation

## 2022-04-27 LAB — ECHOCARDIOGRAM COMPLETE BUBBLE STUDY
Area-P 1/2: 3.6 cm2
S' Lateral: 3.1 cm

## 2022-04-27 NOTE — Patient Instructions (Signed)
Medication Instructions:  Your physician has recommended you make the following change in your medication:  STOP AMOXICILLIN  *If you need a refill on your cardiac medications before your next appointment, please call your pharmacy*   Lab Work: NONE If you have labs (blood work) drawn today and your tests are completely normal, you will receive your results only by: [place] (if you have MyChart) OR A paper copy in the mail If you have any lab test that is abnormal or we need to change your treatment, we will call you to review the results.   Testing/Procedures: NONE   Follow-Up: At [facility], you and your health needs are our priority.  As part of our continuing mission to provide you with exceptional heart care, we have created designated Provider Care Teams.  These Care Teams include your primary Cardiologist (physician) and Advanced Practice Providers (APPs -  Physician Assistants and Nurse Practitioners) who all work together to provide you with the care you need, when you need it.  We recommend signing up for the patient portal called "MyChart".  Sign up information is provided on this After Visit Summary.  MyChart is used to connect with patients for Virtual Visits (Telemedicine).  Patients are able to view lab/test results, encounter notes, upcoming appointments, etc.  Non-urgent messages can be sent to your provider as well.   To learn more about what you can do with MyChart, go to [email or web address].    Your next appointment:   AS NEEDED WITH STRUCTURAL HEART

## 2022-07-02 ENCOUNTER — Ambulatory Visit: Payer: BC Managed Care – PPO | Admitting: Neurology

## 2022-08-31 DIAGNOSIS — M25562 Pain in left knee: Secondary | ICD-10-CM | POA: Diagnosis not present

## 2022-08-31 DIAGNOSIS — M7052 Other bursitis of knee, left knee: Secondary | ICD-10-CM | POA: Diagnosis not present

## 2022-10-12 DIAGNOSIS — M25562 Pain in left knee: Secondary | ICD-10-CM | POA: Diagnosis not present

## 2022-10-15 DIAGNOSIS — M25562 Pain in left knee: Secondary | ICD-10-CM | POA: Diagnosis not present

## 2022-10-20 DIAGNOSIS — M25562 Pain in left knee: Secondary | ICD-10-CM | POA: Diagnosis not present

## 2022-10-28 ENCOUNTER — Telehealth: Payer: Self-pay | Admitting: *Deleted

## 2022-10-28 DIAGNOSIS — S83242D Other tear of medial meniscus, current injury, left knee, subsequent encounter: Secondary | ICD-10-CM | POA: Diagnosis not present

## 2022-10-28 NOTE — Telephone Encounter (Signed)
   Pre-operative Risk Assessment    Patient Name: Mark Mclaughlin  DOB: 1960/11/02 MRN: [id number]    DATE OF LAST VISIT: 04/27/22 JILL McDANIEL DATE OF NEXT VISIT: NONE  Request for Surgical Clearance    Procedure:   LEFT KNEE SCOPE AND PMM  Date of Surgery:  Clearance TBD                                 Surgeon:  DR. Duwayne Heck Surgeon's Group or Practice Name:  [facility] Phone number:  [phone] ATTN: KERRI MAZE Fax number:  [phone]   Type of Clearance Requested:   - Medical ; ASA    Type of Anesthesia:   CHOICE   Additional requests/questions:    Elpidio Anis   10/28/2022, 6:07 PM

## 2022-10-29 ENCOUNTER — Telehealth: Payer: Self-pay

## 2022-10-29 NOTE — Telephone Encounter (Signed)
Pt is scheduled for tele preop on 9/6 at 10:20am. Med rec and consent done

## 2022-10-29 NOTE — Telephone Encounter (Signed)
Pt is scheduled for tele preop on 9/6 at 10:20am. Med rec and consent done    Patient Consent for Virtual Visit        Mark Mclaughlin has provided verbal consent on 10/29/2022 for a virtual visit (video or telephone).   CONSENT FOR VIRTUAL VISIT FOR:  Mark Mclaughlin  By participating in this virtual visit I agree to the following:  I hereby voluntarily request, consent and authorize [facility] and its employed or contracted physicians, physician assistants, nurse practitioners or other licensed health care professionals (the Practitioner), to provide me with telemedicine health care services (the "Services") as deemed necessary by the treating Practitioner. I acknowledge and consent to receive the Services by the Practitioner via telemedicine. I understand that the telemedicine visit will involve communicating with the Practitioner through live audiovisual communication technology and the disclosure of certain medical information by electronic transmission. I acknowledge that I have been given the opportunity to request an in-person assessment or other available alternative prior to the telemedicine visit and am voluntarily participating in the telemedicine visit.  I understand that I have the right to withhold or withdraw my consent to the use of telemedicine in the course of my care at any time, without affecting my right to future care or treatment, and that the Practitioner or I may terminate the telemedicine visit at any time. I understand that I have the right to inspect all information obtained and/or recorded in the course of the telemedicine visit and may receive copies of available information for a reasonable fee.  I understand that some of the potential risks of receiving the Services via telemedicine include:  Delay or interruption in medical evaluation due to technological equipment failure or disruption; Information transmitted may not be sufficient (e.g. poor  resolution of images) to allow for appropriate medical decision making by the Practitioner; and/or  In rare instances, security protocols could fail, causing a breach of personal health information.  Furthermore, I acknowledge that it is my responsibility to provide information about my medical history, conditions and care that is complete and accurate to the best of my ability. I acknowledge that Practitioner's advice, recommendations, and/or decision may be based on factors not within their control, such as incomplete or inaccurate data provided by me or distortions of diagnostic images or specimens that may result from electronic transmissions. I understand that the practice of medicine is not an exact science and that Practitioner makes no warranties or guarantees regarding treatment outcomes. I acknowledge that a copy of this consent can be made available to me via my patient portal [facility] MyChart), or I can request a printed copy by calling the office of [facility].    I understand that my insurance will be billed for this visit.   I have read or had this consent read to me. I understand the contents of this consent, which adequately explains the benefits and risks of the Services being provided via telemedicine.  I have been provided ample opportunity to ask questions regarding this consent and the Services and have had my questions answered to my satisfaction. I give my informed consent for the services to be provided through the use of telemedicine in my medical care

## 2022-10-29 NOTE — Telephone Encounter (Signed)
   Name: Mark Mclaughlin  DOB: 24-Aug-1960  MRN: [id number]  Primary Cardiologist: Nanetta Batty, MD   Preoperative team, please contact this patient and set up a phone call appointment for further preoperative risk assessment. Please obtain consent and complete medication review. Thank you for your help.  I confirm that guidance regarding antiplatelet and oral anticoagulation therapy has been completed and, if necessary, noted below.  From a cardiac standpoint, he may hold Aspirin for 5-7 days prior to procedure.  However, patient also takes aspirin for history of CVA, managed per neurology.  Therefore, additional recommendations for holding aspirin prior to procedure should come from neurology.   Joylene Grapes, NP 10/29/2022, 11:20 AM  [facility]

## 2022-11-06 ENCOUNTER — Ambulatory Visit: Payer: BC Managed Care – PPO | Attending: Cardiology

## 2022-11-06 DIAGNOSIS — Z01810 Encounter for preprocedural cardiovascular examination: Secondary | ICD-10-CM | POA: Diagnosis not present

## 2022-11-06 DIAGNOSIS — Z01818 Encounter for other preprocedural examination: Secondary | ICD-10-CM

## 2022-11-06 NOTE — Progress Notes (Signed)
Virtual Visit via Telephone Note   Because of Zeno Materna co-morbid illnesses, he is at least at moderate risk for complications without adequate follow up.  This format is felt to be most appropriate for this patient at this time.  The patient did not have access to video technology/had technical difficulties with video requiring transitioning to audio format only (telephone).  All issues noted in this document were discussed and addressed.  No physical exam could be performed with this format.  Please refer to the patient's chart for his consent to telehealth for [facility].  Evaluation Performed:  Preoperative cardiovascular risk assessment _____________   Date:  11/06/2022   Patient ID:  Mark Mclaughlin, DOB [birth year], MRN [id number] Patient Location:  Home Provider location:   Office  Primary Care Provider:  Aliene Beams, MD Primary Cardiologist:  Nanetta Batty, MD  Chief Complaint / Patient Profile   61 y.o. y/o male with a h/o hyperlipidemia, hypertension bradycardia, ischemic CVA, PFO, status post PFO closure 04/07/2021.  She is pending left knee scope and PMM with Dr. Duwayne Heck, [facility] on date to be determined, and presents today for telephonic preoperative cardiovascular risk assessment.  History of Present Illness    Mark Mclaughlin is a 62 y.o. male who presents via audio/video conferencing for a telehealth visit today.  Pt was last seen in cardiology clinic on 04/27/2022 by by Georgie Chard, nurse practitioner.  At that time Mark Mclaughlin was doing well .  The patient is now pending procedure as outlined above. Since his last visit, he denies any chest pain, dyspnea on exertion, palpitations, headaches, dizziness, or issues with bleeding or bruising on aspirin.  Past Medical History    Past Medical History:  Diagnosis Date   Hyperlipidemia    Past Surgical History:  Procedure Laterality Date   ANTERIOR CRUCIATE LIGAMENT REPAIR      BUBBLE STUDY  02/14/2021   Procedure: BUBBLE STUDY;  Surgeon: Meriam Sprague, MD;  Location: [facility] ENDOSCOPY;  Service: Cardiovascular;;   PATENT FORAMEN OVALE(PFO) CLOSURE N/A 04/07/2021   Procedure: PATENT FORAMEN OVALE (PFO) CLOSURE;  Surgeon: Tonny Bollman, MD;  Location: [facility] INVASIVE CV LAB;  Service: Cardiovascular;  Laterality: N/A;   TEE WITHOUT CARDIOVERSION N/A 02/14/2021   Procedure: TRANSESOPHAGEAL ECHOCARDIOGRAM (TEE);  Surgeon: Meriam Sprague, MD;  Location: [facility] ENDOSCOPY;  Service: Cardiovascular;  Laterality: N/A;    Allergies  Allergies  Allergen Reactions   Lipitor [Atorvastatin] Other (See Comments)    Muscle aches    Home Medications    Prior to Admission medications   Medication Sig Start Date End Date Taking? Authorizing Provider  aspirin EC 81 MG EC tablet Take 1 tablet (81 mg total) by mouth daily. Swallow whole. 01/23/21   Osvaldo Shipper, MD  Multiple Vitamin (MULTIVITAMIN) tablet Take 1 tablet by mouth daily with supper.    [provider]  pravastatin (PRAVACHOL) 40 MG tablet Take 40 mg by mouth daily. 03/28/21   [provider]    Physical Exam    Vital Signs:  Mark Mclaughlin does not have vital signs available for review today.  Given telephonic nature of communication, physical exam is limited. AAOx3. NAD. Normal affect.  Speech and respirations are unlabored.  Accessory Clinical Findings    None  Assessment & Plan    1.  Preoperative Cardiovascular Risk Assessment:  According to the Revised Cardiac Risk Index (RCRI), his Perioperative Risk of Major Cardiac Event is (%): 0.9  His Functional Capacity in METs  is: 9.89 according to the [facility] Activity Status Index (DASI).   The patient was advised that if he develops new symptoms prior to surgery to contact our office to arrange for a follow-up visit, and he verbalized understanding.  Per office protocol, if patient is without any new symptoms or concerns at the time of  their virtual visit, he/she may hold ASA for 7 days prior to procedure. Please resume ASA as soon as possible postprocedure, at the discretion of the surgeon.    Therefore, based on ACC/AHA guidelines, patient would be at acceptable risk for the planned procedure without further cardiovascular testing. I will route this recommendation to the requesting party via Epic fax function.   A copy of this note will be routed to requesting surgeon.  Time:   Today, I have spent 10 minutes with the patient with telehealth technology discussing medical history, symptoms, and management plan.     Joni Reining, NP  11/06/2022, 10:21 AM

## 2022-12-07 DIAGNOSIS — X58XXXA Exposure to other specified factors, initial encounter: Secondary | ICD-10-CM | POA: Diagnosis not present

## 2022-12-07 DIAGNOSIS — Y999 Unspecified external cause status: Secondary | ICD-10-CM | POA: Diagnosis not present

## 2022-12-07 DIAGNOSIS — S83232A Complex tear of medial meniscus, current injury, left knee, initial encounter: Secondary | ICD-10-CM | POA: Diagnosis not present

## 2022-12-07 DIAGNOSIS — G8918 Other acute postprocedural pain: Secondary | ICD-10-CM | POA: Diagnosis not present

## 2023-01-19 DIAGNOSIS — E782 Mixed hyperlipidemia: Secondary | ICD-10-CM | POA: Diagnosis not present

## 2023-01-19 DIAGNOSIS — Z0000 Encounter for general adult medical examination without abnormal findings: Secondary | ICD-10-CM | POA: Diagnosis not present

## 2023-01-19 DIAGNOSIS — Z125 Encounter for screening for malignant neoplasm of prostate: Secondary | ICD-10-CM | POA: Diagnosis not present

## 2023-01-27 DIAGNOSIS — F411 Generalized anxiety disorder: Secondary | ICD-10-CM | POA: Diagnosis not present

## 2023-05-25 DIAGNOSIS — H61001 Unspecified perichondritis of right external ear: Secondary | ICD-10-CM | POA: Diagnosis not present

## 2023-05-25 DIAGNOSIS — D485 Neoplasm of uncertain behavior of skin: Secondary | ICD-10-CM | POA: Diagnosis not present

## 2023-05-25 DIAGNOSIS — L578 Other skin changes due to chronic exposure to nonionizing radiation: Secondary | ICD-10-CM | POA: Diagnosis not present

## 2023-05-25 DIAGNOSIS — Z85828 Personal history of other malignant neoplasm of skin: Secondary | ICD-10-CM | POA: Diagnosis not present

## 2023-05-25 DIAGNOSIS — L814 Other melanin hyperpigmentation: Secondary | ICD-10-CM | POA: Diagnosis not present

## 2023-05-25 DIAGNOSIS — D229 Melanocytic nevi, unspecified: Secondary | ICD-10-CM | POA: Diagnosis not present

## 2024-02-11 DIAGNOSIS — Z13220 Encounter for screening for lipoid disorders: Secondary | ICD-10-CM | POA: Diagnosis not present

## 2024-02-11 DIAGNOSIS — Z0000 Encounter for general adult medical examination without abnormal findings: Secondary | ICD-10-CM | POA: Diagnosis not present

## 2024-02-11 DIAGNOSIS — F411 Generalized anxiety disorder: Secondary | ICD-10-CM | POA: Diagnosis not present

## 2024-02-11 DIAGNOSIS — Z125 Encounter for screening for malignant neoplasm of prostate: Secondary | ICD-10-CM | POA: Diagnosis not present
